# Patient Record
Sex: Female | Born: 1965 | Race: White | Hispanic: No | Marital: Married | Smoking: Never smoker
Health system: Southern US, Community
[De-identification: ages and names within clinical notes are randomized; demographics above are authoritative.]

## PROBLEM LIST (undated history)

## (undated) DIAGNOSIS — J45909 Unspecified asthma, uncomplicated: Secondary | ICD-10-CM

## (undated) DIAGNOSIS — G4700 Insomnia, unspecified: Secondary | ICD-10-CM

## (undated) DIAGNOSIS — F419 Anxiety disorder, unspecified: Secondary | ICD-10-CM

## (undated) DIAGNOSIS — D219 Benign neoplasm of connective and other soft tissue, unspecified: Secondary | ICD-10-CM

## (undated) HISTORY — DX: Benign neoplasm of connective and other soft tissue, unspecified: D21.9

## (undated) HISTORY — PX: CHOLECYSTECTOMY: SHX55

## (undated) HISTORY — PX: WISDOM TOOTH EXTRACTION: SHX21

## (undated) HISTORY — PX: OTHER SURGICAL HISTORY: SHX169

## (undated) HISTORY — DX: Unspecified asthma, uncomplicated: J45.909

## (undated) HISTORY — DX: Anxiety disorder, unspecified: F41.9

## (undated) HISTORY — DX: Insomnia, unspecified: G47.00

## (undated) HISTORY — PX: ABDOMINAL HYSTERECTOMY: SHX81

---

## 1998-04-25 ENCOUNTER — Other Ambulatory Visit: Admission: RE | Admit: 1998-04-25 | Discharge: 1998-04-25 | Payer: Self-pay | Admitting: Obstetrics & Gynecology

## 1999-04-17 ENCOUNTER — Other Ambulatory Visit: Admission: RE | Admit: 1999-04-17 | Discharge: 1999-04-17 | Payer: Self-pay | Admitting: Gynecology

## 2000-06-07 ENCOUNTER — Other Ambulatory Visit: Admission: RE | Admit: 2000-06-07 | Discharge: 2000-06-07 | Payer: Self-pay | Admitting: Gynecology

## 2002-05-15 ENCOUNTER — Other Ambulatory Visit: Admission: RE | Admit: 2002-05-15 | Discharge: 2002-05-15 | Payer: Self-pay | Admitting: Gynecology

## 2003-05-28 ENCOUNTER — Other Ambulatory Visit: Admission: RE | Admit: 2003-05-28 | Discharge: 2003-05-28 | Payer: Self-pay | Admitting: Gynecology

## 2004-06-15 ENCOUNTER — Other Ambulatory Visit: Admission: RE | Admit: 2004-06-15 | Discharge: 2004-06-15 | Payer: Self-pay | Admitting: Gynecology

## 2004-07-19 ENCOUNTER — Ambulatory Visit: Payer: Self-pay | Admitting: Internal Medicine

## 2005-06-21 ENCOUNTER — Other Ambulatory Visit: Admission: RE | Admit: 2005-06-21 | Discharge: 2005-06-21 | Payer: Self-pay | Admitting: Gynecology

## 2006-06-26 ENCOUNTER — Other Ambulatory Visit: Admission: RE | Admit: 2006-06-26 | Discharge: 2006-06-26 | Payer: Self-pay | Admitting: Gynecology

## 2006-08-02 ENCOUNTER — Ambulatory Visit: Payer: Self-pay | Admitting: Internal Medicine

## 2007-03-06 ENCOUNTER — Ambulatory Visit: Payer: Self-pay | Admitting: Internal Medicine

## 2007-03-06 DIAGNOSIS — F411 Generalized anxiety disorder: Secondary | ICD-10-CM

## 2007-03-06 DIAGNOSIS — F419 Anxiety disorder, unspecified: Secondary | ICD-10-CM | POA: Insufficient documentation

## 2007-03-06 DIAGNOSIS — R002 Palpitations: Secondary | ICD-10-CM | POA: Insufficient documentation

## 2007-04-21 ENCOUNTER — Ambulatory Visit: Payer: Self-pay | Admitting: Internal Medicine

## 2007-04-21 LAB — CONVERTED CEMR LAB
Albumin: 4 g/dL (ref 3.5–5.2)
Basophils Absolute: 0 10*3/uL (ref 0.0–0.1)
Eosinophils Absolute: 0.2 10*3/uL (ref 0.0–0.6)
GFR calc non Af Amer: 85 mL/min
HCT: 40.5 % (ref 36.0–46.0)
Hemoglobin: 13.5 g/dL (ref 12.0–15.0)
MCHC: 33.2 g/dL (ref 30.0–36.0)
MCV: 92.5 fL (ref 78.0–100.0)
Monocytes Absolute: 0.3 10*3/uL (ref 0.2–0.7)
Neutrophils Relative %: 54 % (ref 43.0–77.0)
Potassium: 4.1 meq/L (ref 3.5–5.1)
Sodium: 139 meq/L (ref 135–145)
TSH: 0.74 microintl units/mL (ref 0.35–5.50)
Vitamin B-12: 446 pg/mL (ref 211–911)

## 2007-05-01 ENCOUNTER — Ambulatory Visit: Payer: Self-pay | Admitting: Internal Medicine

## 2008-02-27 HISTORY — PX: OTHER SURGICAL HISTORY: SHX169

## 2009-02-07 ENCOUNTER — Ambulatory Visit (HOSPITAL_BASED_OUTPATIENT_CLINIC_OR_DEPARTMENT_OTHER): Admission: RE | Admit: 2009-02-07 | Discharge: 2009-02-08 | Payer: Self-pay | Admitting: Gynecology

## 2009-09-16 ENCOUNTER — Ambulatory Visit: Payer: Self-pay | Admitting: Internal Medicine

## 2009-10-10 ENCOUNTER — Telehealth: Payer: Self-pay | Admitting: Internal Medicine

## 2009-11-14 ENCOUNTER — Encounter: Payer: Self-pay | Admitting: Internal Medicine

## 2009-11-14 ENCOUNTER — Ambulatory Visit: Payer: Self-pay | Admitting: Internal Medicine

## 2009-11-17 ENCOUNTER — Encounter (INDEPENDENT_AMBULATORY_CARE_PROVIDER_SITE_OTHER): Payer: Self-pay | Admitting: *Deleted

## 2009-11-17 ENCOUNTER — Telehealth: Payer: Self-pay | Admitting: Internal Medicine

## 2010-03-28 NOTE — Assessment & Plan Note (Addendum)
Summary: wheezing/cd   Vital Signs:  Patient profile:   45 year old female Weight:      132 pounds O2 Sat:      98 % on Room air Temp:     97.3 degrees F oral Pulse rate:   94 / minute Pulse rhythm:   regular Resp:     16 per minute BP sitting:   100 / 60  (left arm) Cuff size:   regular  Vitals Entered By: Lanier Prude, CMA(AAMA) (September 16, 2009 3:55 PM)  O2 Flow:  Room air CC:  dry cough/wheezing X 6 wks Is Patient Diabetic? No Comments pt is not taking Fluoxetine, Zolpiedem or Vit D.  please remove from list   CC:   dry cough/wheezing X 6 wks.  History of Present Illness: C/o cough, wheezing x 3-4 wks, triggered by stress. No URI. C/oo dry cough No allergies  Current Medications (verified): 1)  Fluoxetine Hcl 10 Mg  Caps (Fluoxetine Hcl) .... Take 1 Capsule By Mouth Daily 2)  Zolpidem Tartrate 10 Mg  Tabs (Zolpidem Tartrate) .... 1/2 or 1 By Mouth At [facility] Prn 3)  Vitamin D3 1000 Unit  Tabs (Cholecalciferol) .Marland Kitchen.. 1 Qd 4)  Microgestin 1/20 1-20 Mg-Mcg Tabs (Norethindrone Acet-Ethinyl Est)  Allergies (verified): 1)  ! Sulfa  Past History:  Past Medical History: Last updated: 03/06/2007 Anxiety Insomnia  Social History: Last updated: 03/06/2007 Occupation: Married Never Smoked Alcohol use-no Regular exercise-yes  Family History: No heart disease No asthma  Review of Systems       The patient complains of dyspnea on exertion, peripheral edema, and depression.  The patient denies fever, chest pain, and syncope.    Physical Exam  General:  Well-developed,well-nourished,in no acute distress; alert,appropriate and cooperative throughout examination Nose:  External nasal examination shows no deformity or inflammation. Nasal mucosa are pink and moist without lesions or exudates. Mouth:  Oral mucosa and oropharynx without lesions or exudates.  Teeth in good repair. Neck:  No deformities, masses, or tenderness noted. Lungs:  Normal respiratory effort, chest  expands symmetrically. Lungs are clear to auscultation, no crackles or wheezes. Heart:  Normal rate and regular rhythm. S1 and S2 normal without gallop, murmur, click, rub or other extra sounds. Abdomen:  Bowel sounds positive,abdomen soft and non-tender without masses, organomegaly or hernias noted. Extremities:  No clubbing, cyanosis, edema, or deformity noted with normal full range of motion of all joints.   Neurologic:  No cranial nerve deficits noted. Station and gait are normal. Plantar reflexes are down-going bilaterally. DTRs are symmetrical throughout. Sensory, motor and coordinative functions appear intact. Skin:  Intact without suspicious lesions or rashes R leg 6 mm mole Psych:  Cognition and judgment appear intact. Alert and cooperative with normal attention span and concentration. No apparent delusions, illusions, hallucinations   Impression & Recommendations:  Problem # 1:  COUGH (ICD-786.2) x 6 wks Assessment New Diff dx: asthmatic vs postnasal drip vs infectious vs GERD vs other CXR suggested Treat empirically: See "Patient Instructions".   Problem # 2:  Mole Assessment: New Bx suggested  Problem # 3:  ANXIETY (ICD-300.00) Assessment: Unchanged  Her updated medication list for this problem includes:    Fluoxetine Hcl 10 Mg Caps (Fluoxetine hcl) .Marland Kitchen... Take 1 capsule by mouth daily  Complete Medication List: 1)  Fluoxetine Hcl 10 Mg Caps (Fluoxetine hcl) .... Take 1 capsule by mouth daily 2)  Zolpidem Tartrate 10 Mg Tabs (Zolpidem tartrate) .... 1/2 or 1 by mouth at hs  prn 3)  Vitamin D3 1000 Unit Tabs (Cholecalciferol) .Marland Kitchen.. 1 qd 4)  Microgestin 1/20 1-20 Mg-mcg Tabs (Norethindrone acet-ethinyl est) 5)  Zithromax Z-pak 250 Mg Tabs (Azithromycin) .... As dirrected  Patient Instructions: 1)  Advair two times a day  2)  Nexium 1 a day 3)  Lodrain 1 in am 4)  Call in 2 wks Prescriptions: ZITHROMAX Z-PAK 250 MG TABS (AZITHROMYCIN) as dirrected  #1 x 0   Entered and  Authorized by:   Tresa Garter MD   Signed by:   Tresa Garter MD on 09/16/2009   Method used:   Print then Give to Patient   RxID:   [id number]

## 2010-03-28 NOTE — Letter (Addendum)
Summary: [facility] Consult Scheduled Letter  [facility]  [address] [place], [place] [address]   Phone: [phone]  Fax: [phone]      11/17/2009 MRN: [id number]  [facility] [address] [place], [place]  [address]    Dear Ms. Satterfield,      We have scheduled an appointment for you.  At the recommendation of Dr.Plotnikov, we have scheduled you a consult with Dr Sherene Sires on 12/06/09 at 10:15am.  Their phone number is [phone].  If this appointment day and time is not convenient for you, please feel free to call the office of the doctor you are being referred to at the number listed above and reschedule the appointment.    [facility] [address] [place], [place] [address] *Pulmonary Dept. 2nd Floor*    Please give 24hr notice if you need to cancel/reschedule to avoid a $50.00 fee. Also bring insurance card, any CT's or X-ray's that you may have taken out side of cone system, and any co-pay due at time of visit.     Thank you,  Patient Care Coordinator [facility]

## 2010-03-28 NOTE — Assessment & Plan Note (Addendum)
Summary: FU---STC   Vital Signs:  Patient profile:   45 year old female Height:      63 inches Weight:      134 pounds BMI:     23.82 Temp:     98.3 degrees F oral Pulse rate:   76 / minute Pulse rhythm:   regular Resp:     16 per minute BP sitting:   92 / 60  (left arm) Cuff size:   regular  Vitals Entered By: Lanier Prude, CMA(AAMA) (November 14, 2009 9:02 AM) CC: f/u Is Patient Diabetic? No Comments pt is not taking Fluoxetine, Zolpidem, Vit D or  Microgestin.  Pelase remove from list   CC:  f/u.  History of Present Illness: F/u wheezing 2-3 times a week; dry cough. She stopped running due to wheezing and SOB. She has been using Symbicort sporadically - on as needed basis with some relief.  Preventive Screening-Counseling & Management  Alcohol-Tobacco     Smoking Status: never  Current Medications (verified): 1)  Fluoxetine Hcl 10 Mg  Caps (Fluoxetine Hcl) .... Take 1 Capsule By Mouth Daily 2)  Zolpidem Tartrate 10 Mg  Tabs (Zolpidem Tartrate) .... 1/2 or 1 By Mouth At [facility] Prn 3)  Vitamin D3 1000 Unit  Tabs (Cholecalciferol) .Marland Kitchen.. 1 Qd 4)  Microgestin 1/20 1-20 Mg-Mcg Tabs (Norethindrone Acet-Ethinyl Est) 5)  Symbicort 160-4.5 Mcg/act Aero (Budesonide-Formoterol Fumarate) .Marland Kitchen.. 1-2 Inh Two Times A Day  Allergies (verified): 1)  ! Sulfa  Past History:  Past Medical History: Last updated: 03/06/2007 Anxiety Insomnia  Family History: Last updated: 09/16/2009 No heart disease No asthma  Social History: Last updated: 03/06/2007 Occupation: Married Never Smoked Alcohol use-no Regular exercise-yes  Review of Systems       The patient complains of dyspnea on exertion and prolonged cough.  The patient denies anorexia, weight loss, weight gain, chest pain, hemoptysis, abdominal pain, and fever.         No sweats  Physical Exam  General:  Well-developed,well-nourished,in no acute distress; alert,appropriate and cooperative throughout examination Ears:   External ear exam shows no significant lesions or deformities.  Otoscopic examination reveals clear canals, tympanic membranes are intact bilaterally without bulging, retraction, inflammation or discharge. Hearing is grossly normal bilaterally. Nose:  External nasal examination shows no deformity or inflammation. Nasal mucosa are pink and moist without lesions or exudates. Mouth:  Oral mucosa and oropharynx without lesions or exudates.  Teeth in good repair. Neck:  No deformities, masses, or tenderness noted. Lungs:  Normal respiratory effort, chest expands symmetrically. Lungs are clear to auscultation, no crackles or wheezes. Heart:  Normal rate and regular rhythm. S1 and S2 normal without gallop, murmur, click, rub or other extra sounds. Abdomen:  Bowel sounds positive,abdomen soft and non-tender without masses, organomegaly or hernias noted. Msk:  No deformity or scoliosis noted of thoracic or lumbar spine.   Neurologic:  No cranial nerve deficits noted. Station and gait are normal. Plantar reflexes are down-going bilaterally. DTRs are symmetrical throughout. Sensory, motor and coordinative functions appear intact. Skin:  Intact without suspicious lesions or rashes Cervical Nodes:  No lymphadenopathy noted Psych:  Cognition and judgment appear intact. Alert and cooperative with normal attention span and concentration. No apparent delusions, illusions, hallucinations   Impression & Recommendations:  Problem # 1:  COUGH (ICD-786.2) - likely asthmatic bronchitis ? postviral Assessment Unchanged PFT Use Symbicort 1 inh two times a day DAILY Orders: Pulmonary Referral (Pulmonary) T-2 View CXR, Same Day (71020.5TC)  Problem #  2:  ANXIETY (ICD-300.00) Assessment: Improved  Problem # 3:  PALPITATIONS (ICD-785.1) Assessment: Improved  Complete Medication List: 1)  Vitamin D3 1000 Unit Tabs (Cholecalciferol) .Marland Kitchen.. 1 qd 2)  Symbicort 160-4.5 Mcg/act Aero (Budesonide-formoterol fumarate) .Marland Kitchen..  1 inh two times a day  Patient Instructions: 1)  Please schedule a follow-up appointment in 4 months.

## 2010-03-28 NOTE — Progress Notes (Addendum)
Summary: CXR results  Phone Note Call from Patient   Summary of Call: pt wants to know if she needs Abx since xray shows bronchitis???? please advise  Leave Vm  on cell if no answer. Initial call taken by: Lanier Prude, [facility]),  November 17, 2009 11:50 AM  Follow-up for Phone Call        No. It is more like "asthma". No need for antibiotic - keep Pulm appt pls Follow-up by: Tresa Garter MD,  November 17, 2009 12:06 PM  Additional Follow-up for Phone Call Additional follow up Details #1::        Tried to contact pt # above no longer in service. Additional Follow-up by: Orlan Leavens RMA,  November 17, 2009 2:20 PM    Additional Follow-up for Phone Call Additional follow up Details #2::    pt informed  Follow-up by: Lanier Prude, [facility]),  November 18, 2009 8:53 AM

## 2010-03-28 NOTE — Progress Notes (Addendum)
Summary: med change  Phone Note Call from Patient Call back at Home Phone [phone]   Caller: Patient Summary of Call: Patient lmovm stating that she was given a sample of Advair and feels that its not working very well. Patient is requesting something different sent to [facility].Marland KitchenMarland KitchenMarland KitchenAlvy Beal Archie CMA  October 10, 2009 8:40 AM   Follow-up for Phone Call        Try Symbicort instead Follow-up by: Tresa Garter MD,  October 10, 2009 5:23 PM  Additional Follow-up for Phone Call Additional follow up Details #1::        call unable to be completed. Will try again later.........................Marland KitchenLamar Sprinkles, CMA  October 10, 2009 5:47 PM   Pt informed  Additional Follow-up by: Lamar Sprinkles, CMA,  October 11, 2009 3:11 PM    New/Updated Medications: SYMBICORT 160-4.5 MCG/ACT AERO (BUDESONIDE-FORMOTEROL FUMARATE) 1-2 inh two times a day Prescriptions: SYMBICORT 160-4.5 MCG/ACT AERO (BUDESONIDE-FORMOTEROL FUMARATE) 1-2 inh two times a day  #1 x 3   Entered by:   Lamar Sprinkles, CMA   Authorized by:   Tresa Garter MD   Signed by:   Lamar Sprinkles, CMA on 10/11/2009   Method used:   Faxed to ...       [facility] (retail)       [address] [place]       [address]       [place], [place]  [address]       Ph: [phone]       Fax: [phone]   RxID:   [id number]

## 2010-04-14 ENCOUNTER — Institutional Professional Consult (permissible substitution) (INDEPENDENT_AMBULATORY_CARE_PROVIDER_SITE_OTHER): Payer: BC Managed Care – PPO | Admitting: Pulmonary Disease

## 2010-04-14 ENCOUNTER — Encounter: Payer: Self-pay | Admitting: Pulmonary Disease

## 2010-04-14 DIAGNOSIS — J45909 Unspecified asthma, uncomplicated: Secondary | ICD-10-CM | POA: Insufficient documentation
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
## 2010-05-30 ENCOUNTER — Encounter: Payer: Self-pay | Admitting: Pulmonary Disease

## 2010-05-30 LAB — POCT PREGNANCY, URINE: Preg Test, Ur: NEGATIVE

## 2010-06-01 ENCOUNTER — Encounter: Payer: Self-pay | Admitting: Pulmonary Disease

## 2010-06-01 ENCOUNTER — Ambulatory Visit (INDEPENDENT_AMBULATORY_CARE_PROVIDER_SITE_OTHER): Payer: BC Managed Care – PPO | Admitting: Pulmonary Disease

## 2010-06-01 VITALS — BP 110/80 | HR 66 | Temp 97.9°F | Ht 63.0 in | Wt 140.2 lb

## 2010-06-01 DIAGNOSIS — J45909 Unspecified asthma, uncomplicated: Secondary | ICD-10-CM
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
## 2010-06-01 NOTE — Patient Instructions (Signed)
Stay on symbicort 160 twice daily. Around may 15 th, drop to once /day

## 2010-06-01 NOTE — Assessment & Plan Note (Signed)
Stay on symbicort 160 bid x 2 months , then attempt taper off If persistent will investigate for allergies

## 2010-08-04 ENCOUNTER — Encounter: Payer: Self-pay | Admitting: Pulmonary Disease

## 2010-08-04 ENCOUNTER — Ambulatory Visit (INDEPENDENT_AMBULATORY_CARE_PROVIDER_SITE_OTHER): Payer: BC Managed Care – PPO | Admitting: Pulmonary Disease

## 2010-08-04 DIAGNOSIS — J45909 Unspecified asthma, uncomplicated: Secondary | ICD-10-CM

## 2010-08-04 MED ORDER — OMEPRAZOLE 40 MG PO CPDR: 40.0000 mg | DELAYED_RELEASE_CAPSULE | Freq: Every day | ORAL | Status: DC
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
## 2010-08-04 NOTE — Patient Instructions (Signed)
Stay on symbicort 160 twice daily Use albuterol as needed Omeprazole (prilosec ) daily x 6 weeks

## 2010-08-07 NOTE — Assessment & Plan Note (Addendum)
Stay on symbicort 160 bid PPI for acid suppression x 6 wks If persistent symptoms, pursue other etiologies such as RAST, sinuses etc.

## 2011-02-28 ENCOUNTER — Other Ambulatory Visit: Payer: Self-pay | Admitting: Internal Medicine

## 2011-03-26 ENCOUNTER — Other Ambulatory Visit: Payer: Self-pay | Admitting: Gynecology

## 2011-03-26 DIAGNOSIS — Z1231 Encounter for screening mammogram for malignant neoplasm of breast: Secondary | ICD-10-CM

## 2011-04-06 ENCOUNTER — Ambulatory Visit: Payer: BC Managed Care – PPO

## 2011-04-06 ENCOUNTER — Ambulatory Visit
Admission: RE | Admit: 2011-04-06 | Discharge: 2011-04-06 | Disposition: A | Discharge status: Home / Self Care | Payer: BC Managed Care – PPO | Source: Ambulatory Visit | Attending: Gynecology | Admitting: Gynecology

## 2011-04-06 DIAGNOSIS — Z1231 Encounter for screening mammogram for malignant neoplasm of breast: Secondary | ICD-10-CM

## 2011-05-01 ENCOUNTER — Other Ambulatory Visit: Payer: Self-pay | Admitting: Pulmonary Disease

## 2011-05-01 MED ORDER — ALBUTEROL SULFATE HFA 108 (90 BASE) MCG/ACT IN AERS: 2.0000 | INHALATION_SPRAY | Freq: Four times a day (QID) | RESPIRATORY_TRACT | Status: DC | PRN

## 2011-11-20 ENCOUNTER — Other Ambulatory Visit: Payer: Self-pay | Admitting: Internal Medicine

## 2012-01-15 ENCOUNTER — Telehealth: Payer: Self-pay | Admitting: Pulmonary Disease

## 2012-01-15 NOTE — Telephone Encounter (Signed)
I spoke with pt and she stated she has had an increase in her inhaler use. She is not having to use it with exercise and has notice in the past 3 weeks it's getting worse. Pt needed an early morning appt. I advised would have to work her in RA schedule so it would be a wait. She was fine with coming in and seeing someone else until she got in to see RA. She is scheduled with TP 01/17/12 at 9 am d/t work schedule. Nothing further was needed

## 2012-01-17 ENCOUNTER — Encounter: Payer: Self-pay | Admitting: Adult Health

## 2012-01-17 ENCOUNTER — Ambulatory Visit (INDEPENDENT_AMBULATORY_CARE_PROVIDER_SITE_OTHER): Payer: BC Managed Care – PPO | Admitting: Adult Health

## 2012-01-17 VITALS — BP 132/70 | HR 99 | Temp 97.7°F | Ht 62.0 in | Wt 146.0 lb

## 2012-01-17 DIAGNOSIS — J45909 Unspecified asthma, uncomplicated: Secondary | ICD-10-CM

## 2012-01-17 MED ORDER — BUDESONIDE-FORMOTEROL FUMARATE 160-4.5 MCG/ACT IN AERO: 2.0000 | INHALATION_SPRAY | Freq: Two times a day (BID) | RESPIRATORY_TRACT | Status: DC

## 2012-01-17 NOTE — Patient Instructions (Addendum)
Restart Symbicort 160 2 puffs  twice daily, brush, rinse, gargle  Use albuterol as needed Omeprazole (prilosec ) daily x 6 weeks Zyrtec 10mg  At bedtime  For 6 weeks .  follow up Dr. Vassie Loll  In 6 weeks

## 2012-01-17 NOTE — Assessment & Plan Note (Signed)
Exercise induced asthma  Discussed triggers of Asthma w/ AR and GERD  She will re try PPI and zyrtec  Restart ICS/LABA combo on regular basis  Could consider MCT if she continues to doubt asthma dx.   Plan  Restart Symbicort 160 2 puffs  twice daily, brush, rinse, gargle  Use albuterol as needed Omeprazole (prilosec ) daily x 6 weeks Zyrtec 10mg  At bedtime  For 6 weeks .  follow up Dr. Vassie Loll  In 6 weeks
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
## 2012-02-14 ENCOUNTER — Ambulatory Visit: Payer: BC Managed Care – PPO | Admitting: Pulmonary Disease

## 2012-02-15 ENCOUNTER — Ambulatory Visit: Payer: BC Managed Care – PPO | Admitting: Pulmonary Disease

## 2012-05-11 ENCOUNTER — Ambulatory Visit: Payer: BC Managed Care – PPO | Admitting: Emergency Medicine

## 2012-05-11 VITALS — BP 104/64 | HR 93 | Temp 98.5°F | Resp 16 | Ht 63.0 in | Wt 146.6 lb

## 2012-05-11 DIAGNOSIS — L255 Unspecified contact dermatitis due to plants, except food: Secondary | ICD-10-CM

## 2012-05-11 MED ORDER — METHYLPREDNISOLONE ACETATE 80 MG/ML IJ SUSP
80.0000 mg | Freq: Once | INTRAMUSCULAR | Status: AC
  Administered 2012-05-11: 80 mg via INTRAMUSCULAR

## 2012-05-11 MED ORDER — TRIAMCINOLONE ACETONIDE 0.1 % EX CREA: TOPICAL_CREAM | Freq: Two times a day (BID) | CUTANEOUS | Status: DC
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
## 2012-05-11 NOTE — Patient Instructions (Addendum)
Poison Ivy  Poison ivy is a inflammation of the skin (contact dermatitis) caused by touching the allergens on the leaves of the ivy plant following previous exposure to the plant. The rash usually appears 48 hours after exposure. The rash is usually bumps (papules) or blisters (vesicles) in a linear pattern. Depending on your own sensitivity, the rash may simply cause redness and itching, or it may also progress to blisters which may break open. These must be well cared for to prevent secondary bacterial (germ) infection, followed by scarring. Keep any open areas dry, clean, dressed, and covered with an antibacterial ointment if needed. The eyes may also get puffy. The puffiness is worst in the morning and gets better as the day progresses. This dermatitis usually heals without scarring, within 2 to 3 weeks without treatment.  HOME CARE INSTRUCTIONS   Thoroughly wash with soap and water as soon as you have been exposed to poison ivy. You have about one half hour to remove the plant resin before it will cause the rash. This washing will destroy the oil or antigen on the skin that is causing, or will cause, the rash. Be sure to wash under your fingernails as any plant resin there will continue to spread the rash. Do not rub skin vigorously when washing affected area. Poison ivy cannot spread if no oil from the plant remains on your body. A rash that has progressed to weeping sores will not spread the rash unless you have not washed thoroughly. It is also important to wash any clothes you have been wearing as these may carry active allergens. The rash will return if you wear the unwashed clothing, even several days later.  Avoidance of the plant in the future is the best measure. Poison ivy plant can be recognized by the number of leaves. Generally, poison ivy has three leaves with flowering branches on a single stem.  Diphenhydramine may be purchased over the counter and used as needed for itching. Do not drive with  this medication if it makes you drowsy.Ask your caregiver about medication for children.  SEEK MEDICAL CARE IF:   Open sores develop.   Redness spreads beyond area of rash.   You notice purulent (pus-like) discharge.   You have increased pain.   Other signs of infection develop (such as fever).  Document Released: 02/10/2000 Document Revised: 05/07/2011 Document Reviewed: 12/29/2008  [employer] Patient Information 2013 [employer].

## 2012-05-15 ENCOUNTER — Other Ambulatory Visit: Payer: Self-pay | Admitting: Pulmonary Disease

## 2012-05-16 ENCOUNTER — Telehealth: Payer: Self-pay | Admitting: Pulmonary Disease

## 2012-05-16 MED ORDER — ALBUTEROL SULFATE HFA 108 (90 BASE) MCG/ACT IN AERS: 2.0000 | INHALATION_SPRAY | Freq: Four times a day (QID) | RESPIRATORY_TRACT | Status: DC | PRN

## 2012-05-16 NOTE — Telephone Encounter (Signed)
lmtcb x1--pt is due for OV then can send RX

## 2012-05-16 NOTE — Telephone Encounter (Signed)
Pt returned triage's call.  Pt states that she saw TP a few months ago & really needs this med refilled asap.  Pt states she is wheezing pretty badly.  I advised the pt that it had been since 2012 since she had seen RA, so I would have to schedule an appt.  Pt asked to be worked in this afternoon.  RA is not here, so I could not offer an appt w/ RA.  Pt asks to speak w/ nurse asap.    Kelly Lawrence

## 2012-05-16 NOTE — Telephone Encounter (Signed)
I spoke with pt. She scheduled ROV w/ RA 06/09/12 AT 4:00. Her rescue inhaler has been sent to the pharmacy. Pt did not want to scheduled visit for today. She stated when she uses her rescue inhaler it takes care of her wheezing. Nothing further was needed

## 2012-06-09 ENCOUNTER — Encounter: Payer: Self-pay | Admitting: Pulmonary Disease

## 2012-06-09 ENCOUNTER — Ambulatory Visit (INDEPENDENT_AMBULATORY_CARE_PROVIDER_SITE_OTHER): Payer: BC Managed Care – PPO | Admitting: Pulmonary Disease

## 2012-06-09 ENCOUNTER — Other Ambulatory Visit: Payer: Self-pay

## 2012-06-09 ENCOUNTER — Other Ambulatory Visit: Payer: Self-pay | Admitting: Pulmonary Disease

## 2012-06-09 VITALS — BP 114/72 | HR 61 | Temp 97.5°F | Ht 63.0 in | Wt 148.6 lb

## 2012-06-09 DIAGNOSIS — J45909 Unspecified asthma, uncomplicated: Secondary | ICD-10-CM

## 2012-06-09 DIAGNOSIS — Z1231 Encounter for screening mammogram for malignant neoplasm of breast: Secondary | ICD-10-CM
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
## 2012-06-09 NOTE — Patient Instructions (Addendum)
Stay on symbicort twice daily OK to use albuterol prior to exercise COngratulations on your half marathon !

## 2012-06-09 NOTE — Assessment & Plan Note (Signed)
No clear environmental trigger ? 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

## 2012-07-04 ENCOUNTER — Ambulatory Visit
Admission: RE | Admit: 2012-07-04 | Discharge: 2012-07-04 | Disposition: A | Discharge status: Home / Self Care | Payer: BC Managed Care – PPO | Source: Ambulatory Visit

## 2012-07-04 DIAGNOSIS — Z1231 Encounter for screening mammogram for malignant neoplasm of breast: Secondary | ICD-10-CM

## 2012-08-08 ENCOUNTER — Other Ambulatory Visit: Payer: Self-pay | Admitting: Pulmonary Disease

## 2012-11-07 ENCOUNTER — Other Ambulatory Visit (INDEPENDENT_AMBULATORY_CARE_PROVIDER_SITE_OTHER): Payer: BC Managed Care – PPO

## 2012-11-07 ENCOUNTER — Ambulatory Visit (INDEPENDENT_AMBULATORY_CARE_PROVIDER_SITE_OTHER): Payer: BC Managed Care – PPO | Admitting: Internal Medicine

## 2012-11-07 ENCOUNTER — Encounter: Payer: Self-pay | Admitting: Internal Medicine

## 2012-11-07 VITALS — BP 118/72 | HR 72 | Temp 98.6°F | Resp 16 | Wt 149.0 lb

## 2012-11-07 DIAGNOSIS — F411 Generalized anxiety disorder: Secondary | ICD-10-CM

## 2012-11-07 DIAGNOSIS — R5381 Other malaise: Secondary | ICD-10-CM

## 2012-11-07 DIAGNOSIS — R002 Palpitations: Secondary | ICD-10-CM

## 2012-11-07 LAB — SEDIMENTATION RATE: Sed Rate: 16 mm/hr (ref 0–22)

## 2012-11-07 LAB — HEPATIC FUNCTION PANEL
ALT: 17 U/L (ref 0–35)
AST: 18 U/L (ref 0–37)
Albumin: 3.6 g/dL (ref 3.5–5.2)
Alkaline Phosphatase: 54 U/L (ref 39–117)

## 2012-11-07 LAB — URINALYSIS
Bilirubin Urine: NEGATIVE
Ketones, ur: NEGATIVE
Total Protein, Urine: NEGATIVE
Urine Glucose: NEGATIVE

## 2012-11-07 LAB — BASIC METABOLIC PANEL
BUN: 7 mg/dL (ref 6–23)
Calcium: 9 mg/dL (ref 8.4–10.5)
Creatinine, Ser: 0.6 mg/dL (ref 0.4–1.2)
GFR: 110.51 mL/min (ref >60.00)
Glucose, Bld: 83 mg/dL (ref 70–99)

## 2012-11-07 LAB — CBC WITH DIFFERENTIAL/PLATELET
Basophils Absolute: 0 10*3/uL (ref 0.0–0.1)
HCT: 38.4 % (ref 36.0–46.0)
Lymphs Abs: 2.5 10*3/uL (ref 0.7–4.0)
Monocytes Absolute: 0.6 10*3/uL (ref 0.1–1.0)
Monocytes Relative: 7.1 % (ref 3.0–12.0)
Platelets: 312 10*3/uL (ref 150.0–400.0)
RDW: 13 % (ref 11.5–14.6)

## 2012-11-07 LAB — IBC PANEL: Iron: 65 ug/dL (ref 42–145)

## 2012-11-07 MED ORDER — CLONAZEPAM 0.25 MG PO TBDP: 0.2500 mg | ORAL_TABLET | Freq: Two times a day (BID) | ORAL | Status: DC | PRN

## 2012-11-07 NOTE — Assessment & Plan Note (Signed)
Labs

## 2012-11-07 NOTE — Progress Notes (Signed)
  Subjective:    Palpitations  This is a recurrent problem. The current episode started more than 1 year ago. The problem occurs intermittently. The problem has been waxing and waning. The symptoms are aggravated by unknown. Associated symptoms include anxiety. Pertinent negatives include no chest fullness, chest pain, coughing, dizziness, nausea, numbness, shortness of breath, syncope or weakness. She has tried nothing for the symptoms. There are no known risk factors. There is no history of anemia, drug use or hyperthyroidism.    C/o L ear sharp pains  rare C/o palpitations off and on; fatigue - all summer long Running for exercise - no sx's   Review of Systems  Constitutional: Negative for chills, activity change, appetite change, fatigue and unexpected weight change.  HENT: Negative for congestion, mouth sores and sinus pressure.   Eyes: Negative for visual disturbance.  Respiratory: Negative for cough, chest tightness, shortness of breath and wheezing.   Cardiovascular: Positive for palpitations. Negative for chest pain, leg swelling and syncope.  Gastrointestinal: Negative for nausea and abdominal pain.  Genitourinary: Negative for frequency, difficulty urinating and vaginal pain.  Musculoskeletal: Negative for back pain and gait problem.  Skin: Negative for pallor and rash.  Neurological: Negative for dizziness, tremors, weakness, numbness and headaches.  Psychiatric/Behavioral: Negative for suicidal ideas, confusion and sleep disturbance. The patient is not nervous/anxious.        Objective:   Physical Exam  Constitutional: She appears well-developed. No distress.  HENT:  Head: Normocephalic.  Right Ear: External ear normal.  Left Ear: External ear normal.  Nose: Nose normal.  Mouth/Throat: Oropharynx is clear and moist.  Eyes: Conjunctivae are normal. Pupils are equal, round, and reactive to light. Right eye exhibits no discharge. Left eye exhibits no discharge.  Neck:  Normal range of motion. Neck supple. No JVD present. No tracheal deviation present. No thyromegaly present.  Cardiovascular: Normal rate, regular rhythm and normal heart sounds.   Pulmonary/Chest: No stridor. No respiratory distress. She has no wheezes.  Abdominal: Soft. Bowel sounds are normal. She exhibits no distension and no mass. There is no tenderness. There is no rebound and no guarding.  Musculoskeletal: She exhibits no edema and no tenderness.  Lymphadenopathy:    She has no cervical adenopathy.  Neurological: She displays normal reflexes. No cranial nerve deficit. She exhibits normal muscle tone. Coordination normal.  Skin: No rash noted. No erythema.  Psychiatric: She has a normal mood and affect. Her behavior is normal. Judgment and thought content normal.          Assessment & Plan:

## 2012-11-10 NOTE — Assessment & Plan Note (Signed)
See prn meds

## 2012-12-02 ENCOUNTER — Other Ambulatory Visit: Payer: Self-pay | Admitting: Orthopedic Surgery

## 2012-12-02 ENCOUNTER — Ambulatory Visit
Admission: RE | Admit: 2012-12-02 | Discharge: 2012-12-02 | Disposition: A | Discharge status: Home / Self Care | Payer: BC Managed Care – PPO | Source: Ambulatory Visit | Attending: Orthopedic Surgery | Admitting: Orthopedic Surgery

## 2012-12-02 DIAGNOSIS — T148XXA Other injury of unspecified body region, initial encounter: Secondary | ICD-10-CM

## 2012-12-10 ENCOUNTER — Ambulatory Visit: Payer: BC Managed Care – PPO | Admitting: Pulmonary Disease

## 2013-01-01 ENCOUNTER — Other Ambulatory Visit: Payer: Self-pay

## 2013-03-13 ENCOUNTER — Encounter: Payer: Self-pay | Admitting: Gynecology

## 2013-03-13 ENCOUNTER — Ambulatory Visit (INDEPENDENT_AMBULATORY_CARE_PROVIDER_SITE_OTHER): Payer: BC Managed Care – PPO | Admitting: Gynecology

## 2013-03-13 VITALS — BP 120/70 | Ht 63.0 in | Wt 151.0 lb

## 2013-03-13 DIAGNOSIS — Z01419 Encounter for gynecological examination (general) (routine) without abnormal findings: Secondary | ICD-10-CM

## 2013-03-13 DIAGNOSIS — N83209 Unspecified ovarian cyst, unspecified side: Secondary | ICD-10-CM

## 2013-03-13 NOTE — Patient Instructions (Signed)
Follow up for ultrasound as scheduled 

## 2013-03-13 NOTE — Progress Notes (Signed)
Kelly Lawrence 08-Jul-1965 [id number]        48 y.o.  G2P2 new patient for annual exam.  Former patient of Dr. Ubaldo Glassing. Several issues noted below.  Past medical history,surgical history, problem list, medications, allergies, family history and social history were all reviewed and documented in the EPIC chart.  ROS:  Performed and pertinent positives and negatives are included in the history, assessment and plan .  Exam: Kim assistant Filed Vitals:   03/13/13 1456  BP: 120/70  Height: 5\' 3"  (1.6 m)  Weight: 151 lb (68.493 kg)   General appearance  Normal Skin grossly normal Head/Neck normal with no cervical or supraclavicular adenopathy thyroid normal Lungs  clear Cardiac RR, without RMG Abdominal  soft, nontender, without masses, organomegaly or hernia Breasts  examined lying and sitting without masses, retractions, discharge or axillary adenopathy. Pelvic  Ext/BUS/vagina  Normal  Cervix  Normal  Uterus  anteverted, normal size, shape and contour, midline and mobile nontender   Adnexa  Without masses or tenderness    Anus and perineum  Normal   Rectovaginal  Normal sphincter tone without palpated masses or tenderness.    Assessment/Plan:  48 y.o. G2P2 female for annual exam, regular menses, oral contraceptives, vasectomy.   1. Oral contraceptives. Patient on low dose oral contraceptives for menstrual suppression. Had heavy menses with cramping and left ovarian cyst. Was told by Dr. Ubaldo Glassing the cyst wrapped around her ovary by ultrasound several years ago. Was placed on oral contraceptives and did well but wants to stop them now. Husband had vasectomy and she just wants to be off of hormonal contraception. She also notes a decreased libido since being on the pills.  Exam today is normal. Recommend baseline ultrasound now for adnexal assessment. Patient will go ahead and stop her pills and then we'll see how she does. If worsening menses or pain she'll represent for  evaluation. 2. Patient initially noted uterus tacking but on review operative report she had a posterior enterocele repair with cardinal and uterosacral colposuspension by Dr. Ubaldo Glassing. Exam today shows good support. 3. Pap smear reported 02/2012. No Pap smear done today. No history of abnormal Pap smears previously. Recommend repeat at 3 year interval. 4. Mammography 06/2012. Continue with annual mammography. SBE monthly reviewed. 5. Health maintenance. Recently had checkup with Dr. Alain Marion to include lab work. No lab work done today. Followup for ultrasound otherwise annually.   Note: This document was prepared with digital dictation and possible smart phrase technology. Any transcriptional errors that result from this process are unintentional.   Anastasio Auerbach MD, 3:34 PM 03/13/2013

## 2013-03-29 DIAGNOSIS — D219 Benign neoplasm of connective and other soft tissue, unspecified: Secondary | ICD-10-CM

## 2013-03-29 HISTORY — DX: Benign neoplasm of connective and other soft tissue, unspecified: D21.9

## 2013-04-03 ENCOUNTER — Encounter: Payer: Self-pay | Admitting: Gynecology

## 2013-04-03 ENCOUNTER — Ambulatory Visit (INDEPENDENT_AMBULATORY_CARE_PROVIDER_SITE_OTHER): Payer: BC Managed Care – PPO

## 2013-04-03 ENCOUNTER — Ambulatory Visit (INDEPENDENT_AMBULATORY_CARE_PROVIDER_SITE_OTHER): Payer: BC Managed Care – PPO | Admitting: Gynecology

## 2013-04-03 DIAGNOSIS — N83209 Unspecified ovarian cyst, unspecified side: Secondary | ICD-10-CM

## 2013-04-03 DIAGNOSIS — D251 Intramural leiomyoma of uterus: Secondary | ICD-10-CM

## 2013-04-03 NOTE — Progress Notes (Signed)
Patient presents for ultrasound. Was told previously by Dr. Ubaldo Glassing and she had a cyst on her left ovary that wrapped around it. We asked to get a baseline ultrasound now just for pelvic surveillance. She had been on oral contraceptives for menorrhagia suppression and her husband had vasectomy and now she stopped them and are awaiting to see how her spontaneous menses do.  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 Endometrial echo 7.8 mm. Right and left ovaries visualized and normal. Cul-de-sac negative.  Assessment and plan: Small myoma otherwise normal ultrasound. Will keep menstrual calendar. As long as doing well then she will followup in a year for annual exam. Sooner if any issues.

## 2013-04-03 NOTE — Patient Instructions (Signed)
Followup in one year for annual exam, sooner if any issues 

## 2013-06-10 ENCOUNTER — Telehealth: Payer: Self-pay | Admitting: *Deleted

## 2013-06-10 MED ORDER — NORETHINDRONE ACET-ETHINYL EST 1-20 MG-MCG PO TABS: 1.0000 | ORAL_TABLET | Freq: Every day | ORAL | Status: DC

## 2013-06-10 NOTE — Telephone Encounter (Signed)
Okay to restart oral contraceptive. Recommend Loestrin 120 equivalent. Refill through January 2016. Remind patient main risk of oral contraceptive is blood clots like deep venous thrombosis, stroke and heart attack. Risk in an otherwise healthy woman who does not smoke is very low.

## 2013-06-10 NOTE — Telephone Encounter (Signed)
Pt called requesting to start back on low dose oral contraceptives for menstrual suppression. C/o heavy bleeding and cramping, pt was had annual on Jan 2015. Please advise

## 2013-06-10 NOTE — Telephone Encounter (Signed)
Pt informed with the below note, rx sent. 

## 2013-06-30 ENCOUNTER — Telehealth: Payer: Self-pay | Admitting: *Deleted

## 2013-06-30 NOTE — Telephone Encounter (Signed)
Pt called c/o break thru bleeding with new birth control pills, I explained to patient that not unusual to have irregular bleeding with new pills.

## 2013-07-10 ENCOUNTER — Other Ambulatory Visit: Payer: Self-pay

## 2013-07-10 DIAGNOSIS — Z1231 Encounter for screening mammogram for malignant neoplasm of breast: Secondary | ICD-10-CM

## 2013-07-27 ENCOUNTER — Other Ambulatory Visit: Payer: Self-pay | Admitting: Internal Medicine

## 2013-08-10 ENCOUNTER — Ambulatory Visit
Admission: RE | Admit: 2013-08-10 | Discharge: 2013-08-10 | Disposition: A | Discharge status: Home / Self Care | Payer: BC Managed Care – PPO | Source: Ambulatory Visit

## 2013-08-10 DIAGNOSIS — Z1231 Encounter for screening mammogram for malignant neoplasm of breast: Secondary | ICD-10-CM

## 2013-12-11 ENCOUNTER — Other Ambulatory Visit: Payer: Self-pay

## 2013-12-28 ENCOUNTER — Encounter: Payer: Self-pay | Admitting: Gynecology

## 2014-02-24 ENCOUNTER — Ambulatory Visit (INDEPENDENT_AMBULATORY_CARE_PROVIDER_SITE_OTHER): Payer: BC Managed Care – PPO | Admitting: Internal Medicine

## 2014-02-24 ENCOUNTER — Encounter: Payer: Self-pay | Admitting: Internal Medicine

## 2014-02-24 VITALS — BP 108/68 | HR 60 | Temp 98.3°F | Resp 12 | Ht 63.0 in | Wt 150.0 lb

## 2014-02-24 DIAGNOSIS — Z0000 Encounter for general adult medical examination without abnormal findings: Secondary | ICD-10-CM

## 2014-02-24 DIAGNOSIS — R002 Palpitations: Secondary | ICD-10-CM

## 2014-02-24 DIAGNOSIS — F411 Generalized anxiety disorder: Secondary | ICD-10-CM

## 2014-02-24 DIAGNOSIS — G4700 Insomnia, unspecified: Secondary | ICD-10-CM

## 2014-02-24 DIAGNOSIS — J4520 Mild intermittent asthma, uncomplicated: Secondary | ICD-10-CM

## 2014-02-24 MED ORDER — CLONAZEPAM 0.5 MG PO TABS: 0.5000 mg | ORAL_TABLET | Freq: Two times a day (BID) | ORAL | Status: DC | PRN

## 2014-02-24 NOTE — Assessment & Plan Note (Signed)
Clonazepam prn. Dose increased  Potential benefits of a long term benzodiazepines  use as well as potential risks  and complications were explained to the patient and were aknowledged.
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
## 2014-02-24 NOTE — Patient Instructions (Addendum)
Valerian root Valerian and hops Melatonin Less carbs at dinner Vit D 1000-2000 iu

## 2014-02-24 NOTE — Assessment & Plan Note (Signed)
Clonazepam prn - rare use. Dose increased. Use Valerian root  Potential benefits of a long term benzodiazepines  use as well as potential risks  and complications were explained to the patient and were aknowledged.

## 2014-02-24 NOTE — Assessment & Plan Note (Signed)
Much better  Use MDI prn

## 2014-02-24 NOTE — Assessment & Plan Note (Signed)
Better  

## 2014-03-06 ENCOUNTER — Other Ambulatory Visit: Payer: Self-pay | Admitting: Gynecology

## 2014-03-15 ENCOUNTER — Encounter: Payer: Self-pay | Admitting: Gynecology

## 2014-03-15 ENCOUNTER — Ambulatory Visit (INDEPENDENT_AMBULATORY_CARE_PROVIDER_SITE_OTHER): Payer: BLUE CROSS/BLUE SHIELD | Admitting: Gynecology

## 2014-03-15 VITALS — BP 120/76 | Ht 64.0 in | Wt 149.0 lb

## 2014-03-15 DIAGNOSIS — Z01419 Encounter for gynecological examination (general) (routine) without abnormal findings: Secondary | ICD-10-CM

## 2014-03-15 DIAGNOSIS — N926 Irregular menstruation, unspecified: Secondary | ICD-10-CM

## 2014-03-15 LAB — TSH: TSH: 1.189 u[IU]/mL (ref 0.350–4.500)

## 2014-03-15 LAB — CBC WITH DIFFERENTIAL/PLATELET
BASOS PCT: 1 % (ref 0–1)
Basophils Absolute: 0.1 10*3/uL (ref 0.0–0.1)
Eosinophils Absolute: 0.4 10*3/uL (ref 0.0–0.7)
Eosinophils Relative: 5 % (ref 0–5)
HEMATOCRIT: 38.1 % (ref 36.0–46.0)
Hemoglobin: 12.8 g/dL (ref 12.0–15.0)
Lymphocytes Relative: 33 % (ref 12–46)
Lymphs Abs: 2.5 10*3/uL (ref 0.7–4.0)
MCH: 30.6 pg (ref 26.0–34.0)
MCHC: 33.6 g/dL (ref 30.0–36.0)
MCV: 91.1 fL (ref 78.0–100.0)
MONOS PCT: 7 % (ref 3–12)
MPV: 9.6 fL (ref 8.6–12.4)
Monocytes Absolute: 0.5 10*3/uL (ref 0.1–1.0)
Neutro Abs: 4.2 10*3/uL (ref 1.7–7.7)
Neutrophils Relative %: 54 % (ref 43–77)
Platelets: 323 10*3/uL (ref 150–400)
RBC: 4.18 MIL/uL (ref 3.87–5.11)
RDW: 12.7 % (ref 11.5–15.5)
WBC: 7.7 10*3/uL (ref 4.0–10.5)

## 2014-03-15 LAB — PROLACTIN: Prolactin: 7.3 ng/mL

## 2014-03-15 MED ORDER — NORETHINDRONE-ETH ESTRADIOL 1-35 MG-MCG PO TABS: 1.0000 | ORAL_TABLET | Freq: Every day | ORAL | Status: DC

## 2014-03-15 NOTE — Progress Notes (Signed)
Kelly Lawrence 06-Apr-1965 [id number]        49 y.o.  G2P2 for annual exam.  Several issues noted below.  Past medical history,surgical history, problem list, medications, allergies, family history and social history were all reviewed and documented as reviewed in the EPIC chart.  ROS:  Performed with pertinent positives and negatives included in the history, assessment and plan.   Additional significant findings :  none   Exam: Kelly Lawrence Vitals:   03/15/14 1350  BP: 120/76  Height: 5\' 4"  (1.626 m)  Weight: 149 lb (67.586 kg)   General appearance:  Normal affect, orientation and appearance. Skin: Grossly normal HEENT: Without gross lesions.  No cervical or supraclavicular adenopathy. Thyroid normal.  Lungs:  Clear without wheezing, rales or rhonchi Cardiac: RR, without RMG Abdominal:  Soft, nontender, without masses, guarding, rebound, organomegaly or hernia Breasts:  Examined lying and sitting without masses, retractions, discharge or axillary adenopathy. Pelvic:  Ext/BUS/vagina normal with menses flow  Cervix normal with menses flow  Uterus anteverted, normal size, shape and contour, midline and mobile nontender   Adnexa  Without masses or tenderness    Anus and perineum  Normal   Rectovaginal  Normal sphincter tone without palpated masses or tenderness.    Assessment/Plan:  49 y.o. G2P2 female for annual exam, vasectomy birth control.   1. Irregular bleeding. Patient on low-dose oral contraceptives or menstrual regulation/dysmenorrhea control. Had tried stopping them last year after her husband's vasectomy but had heavy painful periods and reinitiated them. She notes the third week into the birth control pill she'll start to bleed before she is done with the active pills. Ultrasound last year showed small 20 mm myoma but no other abnormalities. Will check baseline labs to include CBC TSH prolactin. Schedule sonohysterogram rule out intracavitary abnormalities.  Management options to include changing pills, Mirena IUD trial, endometrial ablation, hysterectomy were reviewed. The pros/cons of each choice discussed. At this point patient does not want to do anything major and she is training for a marathon. Will switch her pills to the Ortho-Novum 1/35 equivalent and she'll follow up for the above testing. 2. Decreased libido. Reviewed issues with decreased libido. Also reviewed possible contribution from the oral contraceptives. Unfortunate she cannot function off of the oral contraceptives at this time. We'll further pursue other alternatives as above. 3. Pap smear 2014. No Pap smear done today. No history of abnormal Pap smears previously. Plan repeat Pap smear next year at three-year interval per current screening guidelines. 4. Mammography 07/2013. Continue with annual mammography. SBE monthly reviewed. 5. Health maintenance. No routine blood work done and she reports this done at Dr. Judeen Lawrence office. Follow up for above testing.     Kelly Auerbach MD, 2:26 PM 03/15/2014

## 2014-03-15 NOTE — Patient Instructions (Signed)
Follow up for the ultrasound as scheduled.  You may obtain a copy of any labs that were done today by logging onto MyChart as outlined in the instructions provided with your AVS (after visit summary). The office will not call with normal lab results but certainly if there are any significant abnormalities then we will contact you.   Health Maintenance, Female A healthy lifestyle and preventative care can promote health and wellness.  Maintain regular health, dental, and eye exams.  Eat a healthy diet. Foods like vegetables, fruits, whole grains, low-fat dairy products, and lean protein foods contain the nutrients you need without too many calories. Decrease your intake of foods high in solid fats, added sugars, and salt. Get information about a proper diet from your caregiver, if necessary.  Regular physical exercise is one of the most important things you can do for your health. Most adults should get at least 150 minutes of moderate-intensity exercise (any activity that increases your heart rate and causes you to sweat) each week. In addition, most adults need muscle-strengthening exercises on 2 or more days a week.   Maintain a healthy weight. The body mass index (BMI) is a screening tool to identify possible weight problems. It provides an estimate of body fat based on height and weight. Your caregiver can help determine your BMI, and can help you achieve or maintain a healthy weight. For adults 20 years and older:  A BMI below 18.5 is considered underweight.  A BMI of 18.5 to 24.9 is normal.  A BMI of 25 to 29.9 is considered overweight.  A BMI of 30 and above is considered obese.  Maintain normal blood lipids and cholesterol by exercising and minimizing your intake of saturated fat. Eat a balanced diet with plenty of fruits and vegetables. Blood tests for lipids and cholesterol should begin at age 71 and be repeated every 5 years. If your lipid or cholesterol levels are high, you are  over 50, or you are a high risk for heart disease, you may need your cholesterol levels checked more frequently.Ongoing high lipid and cholesterol levels should be treated with medicines if diet and exercise are not effective.  If you smoke, find out from your caregiver how to quit. If you do not use tobacco, do not start.  Lung cancer screening is recommended for adults aged 43 80 years who are at high risk for developing lung cancer because of a history of smoking. Yearly low-dose computed tomography (CT) is recommended for people who have at least a 30-pack-year history of smoking and are a current smoker or have quit within the past 15 years. A pack year of smoking is smoking an average of 1 pack of cigarettes a day for 1 year (for example: 1 pack a day for 30 years or 2 packs a day for 15 years). Yearly screening should continue until the smoker has stopped smoking for at least 15 years. Yearly screening should also be stopped for people who develop a health problem that would prevent them from having lung cancer treatment.  If you are pregnant, do not drink alcohol. If you are breastfeeding, be very cautious about drinking alcohol. If you are not pregnant and choose to drink alcohol, do not exceed 1 drink per day. One drink is considered to be 12 ounces (355 mL) of beer, 5 ounces (148 mL) of wine, or 1.5 ounces (44 mL) of liquor.  Avoid use of street drugs. Do not share needles with anyone. Ask for help  if you need support or instructions about stopping the use of drugs.  High blood pressure causes heart disease and increases the risk of stroke. Blood pressure should be checked at least every 1 to 2 years. Ongoing high blood pressure should be treated with medicines, if weight loss and exercise are not effective.  If you are 55 to 47 years old, ask your caregiver if you should take aspirin to prevent strokes.  Diabetes screening involves taking a blood sample to check your fasting blood sugar level.  This should be done once every 3 years, after age 45, if you are within normal weight and without risk factors for diabetes. Testing should be considered at a younger age or be carried out more frequently if you are overweight and have at least 1 risk factor for diabetes.  Breast cancer screening is essential preventative care for women. You should practice "breast self-awareness." This means understanding the normal appearance and feel of your breasts and may include breast self-examination. Any changes detected, no matter how small, should be reported to a caregiver. Women in their 20s and 30s should have a clinical breast exam (CBE) by a caregiver as part of a regular health exam every 1 to 3 years. After age 40, women should have a CBE every year. Starting at age 40, women should consider having a mammogram (breast X-ray) every year. Women who have a family history of breast cancer should talk to their caregiver about genetic screening. Women at a high risk of breast cancer should talk to their caregiver about having an MRI and a mammogram every year.  Breast cancer gene (BRCA)-related cancer risk assessment is recommended for women who have family members with BRCA-related cancers. BRCA-related cancers include breast, ovarian, tubal, and peritoneal cancers. Having family members with these cancers may be associated with an increased risk for harmful changes (mutations) in the breast cancer genes BRCA1 and BRCA2. Results of the assessment will determine the need for genetic counseling and BRCA1 and BRCA2 testing.  The Pap test is a screening test for cervical cancer. Women should have a Pap test starting at age 21. Between ages 21 and 29, Pap tests should be repeated every 2 years. Beginning at age 30, you should have a Pap test every 3 years as long as the past 3 Pap tests have been normal. If you had a hysterectomy for a problem that was not cancer or a condition that could lead to cancer, then you no  longer need Pap tests. If you are between ages 65 and 70, and you have had normal Pap tests going back 10 years, you no longer need Pap tests. If you have had past treatment for cervical cancer or a condition that could lead to cancer, you need Pap tests and screening for cancer for at least 20 years after your treatment. If Pap tests have been discontinued, risk factors (such as a new sexual partner) need to be reassessed to determine if screening should be resumed. Some women have medical problems that increase the chance of getting cervical cancer. In these cases, your caregiver may recommend more frequent screening and Pap tests.  The human papillomavirus (HPV) test is an additional test that may be used for cervical cancer screening. The HPV test looks for the virus that can cause the cell changes on the cervix. The cells collected during the Pap test can be tested for HPV. The HPV test could be used to screen women aged 30 years and older, and   should be used in women of any age who have unclear Pap test results. After the age of 7, women should have HPV testing at the same frequency as a Pap test.  Colorectal cancer can be detected and often prevented. Most routine colorectal cancer screening begins at the age of 20 and continues through age 36. However, your caregiver may recommend screening at an earlier age if you have risk factors for colon cancer. On a yearly basis, your caregiver may provide home test kits to check for hidden blood in the stool. Use of a small camera at the end of a tube, to directly examine the colon (sigmoidoscopy or colonoscopy), can detect the earliest forms of colorectal cancer. Talk to your caregiver about this at age 46, when routine screening begins. Direct examination of the colon should be repeated every 5 to 10 years through age 62, unless early forms of pre-cancerous polyps or small growths are found.  Hepatitis C blood testing is recommended for all people born  from [birth year] through 1963 and any individual with known risks for hepatitis C.  Practice safe sex. Use condoms and avoid high-risk sexual practices to reduce the spread of sexually transmitted infections (STIs). Sexually active women aged 55 and younger should be checked for Chlamydia, which is a common sexually transmitted infection. Older women with new or multiple partners should also be tested for Chlamydia. Testing for other STIs is recommended if you are sexually active and at increased risk.  Osteoporosis is a disease in which the bones lose minerals and strength with aging. This can result in serious bone fractures. The risk of osteoporosis can be identified using a bone density scan. Women ages 31 and over and women at risk for fractures or osteoporosis should discuss screening with their caregivers. Ask your caregiver whether you should be taking a calcium supplement or vitamin D to reduce the rate of osteoporosis.  Menopause can be associated with physical symptoms and risks. Hormone replacement therapy is available to decrease symptoms and risks. You should talk to your caregiver about whether hormone replacement therapy is right for you.  Use sunscreen. Apply sunscreen liberally and repeatedly throughout the day. You should seek shade when your shadow is shorter than you. Protect yourself by wearing long sleeves, pants, a wide-brimmed hat, and sunglasses year round, whenever you are outdoors.  Notify your caregiver of new moles or changes in moles, especially if there is a change in shape or color. Also notify your caregiver if a mole is larger than the size of a pencil eraser.  Stay current with your immunizations. Document Released: 08/28/2010 Document Revised: 06/09/2012 Document Reviewed: 08/28/2010 [facility] Patient Information 2014 [place].

## 2014-03-17 ENCOUNTER — Other Ambulatory Visit: Payer: Self-pay | Admitting: Gynecology

## 2014-03-17 DIAGNOSIS — N946 Dysmenorrhea, unspecified: Secondary | ICD-10-CM

## 2014-03-17 DIAGNOSIS — N926 Irregular menstruation, unspecified: Secondary | ICD-10-CM

## 2014-03-22 ENCOUNTER — Ambulatory Visit (INDEPENDENT_AMBULATORY_CARE_PROVIDER_SITE_OTHER): Payer: BLUE CROSS/BLUE SHIELD | Admitting: Gynecology

## 2014-03-22 ENCOUNTER — Other Ambulatory Visit: Payer: Self-pay | Admitting: Gynecology

## 2014-03-22 ENCOUNTER — Encounter: Payer: Self-pay | Admitting: Gynecology

## 2014-03-22 ENCOUNTER — Ambulatory Visit (INDEPENDENT_AMBULATORY_CARE_PROVIDER_SITE_OTHER): Payer: BLUE CROSS/BLUE SHIELD

## 2014-03-22 DIAGNOSIS — N921 Excessive and frequent menstruation with irregular cycle: Secondary | ICD-10-CM

## 2014-03-22 DIAGNOSIS — N926 Irregular menstruation, unspecified: Secondary | ICD-10-CM

## 2014-03-22 DIAGNOSIS — N946 Dysmenorrhea, unspecified: Secondary | ICD-10-CM

## 2014-03-22 DIAGNOSIS — D251 Intramural leiomyoma of uterus: Secondary | ICD-10-CM

## 2014-03-22 NOTE — Patient Instructions (Signed)
Start on the birth control pill as we discussed. Call if breakthrough bleeding continues after several packs.

## 2014-03-22 NOTE — Progress Notes (Signed)
Kelly Lawrence 22-Nov-1965 [id number]        49 y.o.  G2P2 presents for sonohysterogram due to persistent breakthrough bleeding on oral contraceptives. CBC, TSH and prolactin were normal.  Past medical history,surgical history, problem list, medications, allergies, family history and social history were all reviewed and documented in the EPIC chart.  Directed ROS with pertinent positives and negatives documented in the history of present illness/assessment and plan.  Exam: [place] assistant General appearance:  Normal External BUS vagina normal. Cervix normal.  Ultrasound shows uterus normal size. 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 Abuts against posterior endometrial wall with no significant intracavitary portion. Endometrial echo 9.6 mm. Right and left ovaries with physiologic changes. Cul-de-sac negative.  Sonohysterogram performed, sterile technique, easy catheter introduction, good distention with no abnormalities. Endometrial sample taken. Patient tolerated well.  Assessment/Plan:  49 y.o. G2P2 with history as above. Patient will follow up for endometrial biopsy results. Just started the new or oral contraceptives with a slightly higher dose of estrogen at the 1/35 level. Will cycle times several cycles. Assuming she does well then we'll continue for the year. If rectal bleeding continue she'll follow up after several paks.     Anastasio Auerbach MD, 12:50 PM 03/22/2014

## 2014-03-29 ENCOUNTER — Other Ambulatory Visit: Payer: Self-pay | Admitting: Gynecology

## 2014-07-21 ENCOUNTER — Other Ambulatory Visit: Payer: Self-pay

## 2014-07-21 DIAGNOSIS — Z1231 Encounter for screening mammogram for malignant neoplasm of breast: Secondary | ICD-10-CM

## 2014-08-19 ENCOUNTER — Ambulatory Visit: Payer: Self-pay

## 2014-08-24 ENCOUNTER — Ambulatory Visit
Admission: RE | Admit: 2014-08-24 | Discharge: 2014-08-24 | Disposition: A | Discharge status: Home / Self Care | Payer: BLUE CROSS/BLUE SHIELD | Source: Ambulatory Visit

## 2014-08-24 DIAGNOSIS — Z1231 Encounter for screening mammogram for malignant neoplasm of breast: Secondary | ICD-10-CM

## 2014-12-09 ENCOUNTER — Ambulatory Visit: Payer: BLUE CROSS/BLUE SHIELD | Admitting: Internal Medicine

## 2015-02-05 ENCOUNTER — Other Ambulatory Visit: Payer: Self-pay | Admitting: Gynecology

## 2015-02-23 ENCOUNTER — Encounter: Payer: Self-pay | Admitting: Internal Medicine

## 2015-02-23 ENCOUNTER — Ambulatory Visit (INDEPENDENT_AMBULATORY_CARE_PROVIDER_SITE_OTHER): Payer: BLUE CROSS/BLUE SHIELD | Admitting: Internal Medicine

## 2015-02-23 VITALS — BP 106/68 | HR 58 | Temp 98.2°F | Wt 151.0 lb

## 2015-02-23 DIAGNOSIS — L309 Dermatitis, unspecified: Secondary | ICD-10-CM

## 2015-02-23 MED ORDER — HALOBETASOL PROPIONATE 0.05 % EX OINT: TOPICAL_OINTMENT | CUTANEOUS | Status: DC

## 2015-02-23 NOTE — Progress Notes (Signed)
Subjective:  Patient ID: Kelly Lawrence, female    DOB: [birth year]  Age: 49 y.o. MRN: [id number]  CC: No chief complaint on file.   HPI Kelly Lawrence presents for a rash on R chin x 3 weeks  Outpatient Prescriptions Prior to Visit  Medication Sig Dispense Refill  . Cholecalciferol (VITAMIN D PO) Take by mouth.    . clonazePAM (KLONOPIN) 0.5 MG tablet Take 1 tablet (0.5 mg total) by mouth 2 (two) times daily as needed for anxiety (or prn insomnia). 60 tablet 2  . ferrous sulfate 325 (65 FE) MG tablet Take 325 mg by mouth at bedtime.      . Multiple Vitamin (MULTIVITAMIN) tablet Take 1 tablet by mouth daily.     Marland Kitchen NECON 1/35, 28, tablet TAKE 1 TABLET BY MOUTH EVERY DAY. USE CONTINUOUSLY. 56 tablet 0  . PROAIR HFA 108 (90 BASE) MCG/ACT inhaler INHALE 2 PUFFS BY MOUTH EVERY 6 HOURS AS NEEDED 1 Inhaler 3  . MICROGESTIN 1-20 MG-MCG tablet TAKE 1 TABLET BY MOUTH EVERY DAY (Patient not taking: Reported on 02/23/2015) 21 tablet 0   No facility-administered medications prior to visit.    ROS Review of Systems  Constitutional: Negative for chills, activity change, fatigue and unexpected weight change.  HENT: Negative for congestion, mouth sores and sinus pressure.   Eyes: Negative for visual disturbance.  Respiratory: Negative for cough and chest tightness.   Genitourinary: Negative for difficulty urinating.  Musculoskeletal: Negative for back pain.  Skin: Positive for rash. Negative for pallor.  Neurological: Negative for dizziness, numbness and headaches.  Psychiatric/Behavioral: Negative for sleep disturbance.   Papular rash on R chin Objective:  BP 106/68 mmHg  Pulse 58  Temp(Src) 98.2 F (36.8 C) (Oral)  Wt 151 lb (68.493 kg)  SpO2 97%  BP Readings from Last 3 Encounters:  02/23/15 106/68  03/15/14 120/76  02/24/14 108/68    Wt Readings from Last 3 Encounters:  02/23/15 151 lb (68.493 kg)  03/15/14 149 lb (67.586 kg)  02/24/14 150 lb (68.04 kg)    Physical Exam    Constitutional: No distress.  Musculoskeletal: She exhibits no edema.  Skin: Rash noted. No erythema.  Psychiatric: She has a normal mood and affect.    Lab Results  Component Value Date   WBC 7.7 03/15/2014   HGB 12.8 03/15/2014   HCT 38.1 03/15/2014   PLT 323 03/15/2014   GLUCOSE 83 11/07/2012   ALT 17 11/07/2012   AST 18 11/07/2012   NA 139 11/07/2012   K 3.9 11/07/2012   CL 106 11/07/2012   CREATININE 0.6 11/07/2012   BUN 7 11/07/2012   CO2 28 11/07/2012   TSH 1.189 03/15/2014    Mm Digital Screening Bilateral  08/25/2014  CLINICAL DATA:  Screening. EXAM: DIGITAL SCREENING BILATERAL MAMMOGRAM WITH CAD COMPARISON:  Previous exam(s). ACR Breast Density Category d: The breast tissue is extremely dense, which lowers the sensitivity of mammography. FINDINGS: There are no findings suspicious for malignancy. Images were processed with CAD. IMPRESSION: No mammographic evidence of malignancy. A result letter of this screening mammogram will be mailed directly to the patient. RECOMMENDATION: Screening mammogram in one year. (Code:SM-B-01Y) BI-RADS CATEGORY  1: Negative. Electronically Signed   By: Conchita Paris M.D.   On: 08/25/2014 14:46    Assessment & Plan:   Diagnoses and all orders for this visit:  Dermatitis  Other orders -     halobetasol (ULTRAVATE) 0.05 % ointment; Use bid on rash  I have discontinued Ms. Underberg's MICROGESTIN. I am also having her start on halobetasol. Additionally, I am having her maintain her ferrous sulfate, multivitamin, PROAIR HFA, clonazePAM, Cholecalciferol (VITAMIN D PO), and NECON 1/35 (28).  Meds ordered this encounter  Medications  . halobetasol (ULTRAVATE) 0.05 % ointment    Sig: Use bid on rash    Dispense:  15 g    Refill:  1     Follow-up: No Follow-up on file.  Walker Kehr, MD

## 2015-02-23 NOTE — Assessment & Plan Note (Addendum)
Chin rash dermatitis Ultravate cream

## 2015-02-23 NOTE — Progress Notes (Signed)
Pre visit review using our clinic review tool, if applicable. No additional management support is needed unless otherwise documented below in the visit note. 

## 2015-02-28 ENCOUNTER — Encounter: Payer: Self-pay | Admitting: Internal Medicine

## 2015-03-18 ENCOUNTER — Encounter: Payer: Self-pay | Admitting: Gynecology

## 2015-03-18 ENCOUNTER — Ambulatory Visit (INDEPENDENT_AMBULATORY_CARE_PROVIDER_SITE_OTHER): Payer: BLUE CROSS/BLUE SHIELD | Admitting: Gynecology

## 2015-03-18 VITALS — BP 110/64 | Ht 63.0 in | Wt 153.0 lb

## 2015-03-18 DIAGNOSIS — Z01419 Encounter for gynecological examination (general) (routine) without abnormal findings: Secondary | ICD-10-CM

## 2015-03-18 DIAGNOSIS — Z13220 Encounter for screening for lipoid disorders: Secondary | ICD-10-CM | POA: Diagnosis not present

## 2015-03-18 MED ORDER — NORETHINDRONE-ETH ESTRADIOL 1-35 MG-MCG PO TABS: ORAL_TABLET | ORAL | Status: DC

## 2015-03-18 NOTE — Progress Notes (Signed)
Kelly Lawrence [birth year] [id number]        50 y.o.  G2P2  for annual exam.  Doing well. Several issues noted below.  Past medical history,surgical history, problem list, medications, allergies, family history and social history were all reviewed and documented as reviewed in the EPIC chart.  ROS:  Performed with pertinent positives and negatives included in the history, assessment and plan.   Additional significant findings :  none   Exam: [name] assistant Filed Vitals:   03/18/15 1419  BP: 110/64  Height: 5\' 3"  (1.6 m)  Weight: 153 lb (69.4 kg)   General appearance:  Normal affect, orientation and appearance. Skin: Grossly normal HEENT: Without gross lesions.  No cervical or supraclavicular adenopathy. Thyroid normal.  Lungs:  Clear without wheezing, rales or rhonchi Cardiac: RR, without RMG Abdominal:  Soft, nontender, without masses, guarding, rebound, organomegaly or hernia Breasts:  Examined lying and sitting without masses, retractions, discharge or axillary adenopathy. Pelvic:  Ext/BUS/vagina normal  Cervix normal  Uterus anteverted, normal size, shape and contour, midline and mobile nontender   Adnexa  Without masses or tenderness    Anus and perineum  Normal   Rectovaginal  Normal sphincter tone without palpated masses or tenderness.    Assessment/Plan:  50 y.o. G2P2 female for annual exam without menses, continuous oral contraceptives and vasectomy.   1. Continuous oral contraceptives. Patient continues on Ortho-Novum 1/35 equivalents continuously for menstrual suppression. Whenever she discontinues that she has irregular on and off bleeding.  sonohysterogram and biopsy negative last year with normal TSH. Husband with vasectomy. Options for management discussed to include continued oral contraceptives either at the 1/35 dose or considering decreasing to the 1/20 dose. Risks to include thrombosis such as stroke heart attack DVT reviewed. Possible lower risk with  lower dose pill. Does not smoke or being followed for medical issues. Runs marathons.  Alternatives to include trial of Mirena IUD for continued menstrual suppression or stopping the pills altogether in seeing what she does discussed. Patient interested in trying Mirena. We'll continue on the 1/35 for now 3 months prescribed and she'll follow for IUD placement. If she decides against this and wants to continue on the pills she will call we'll switch her to the 1/20 equivalent. 2. Pap smear 2014. No Pap smear done today. No history of significant abnormal Pap smears. Plan repeat Pap smear next year at three-year interval. 3. Mammography 07/2014. Continue with annual mammography when due. SBE monthly reviewed. 4. Health maintenance. Patient requests baseline labs and will return fasting. Future orders placed for CBC, comprehensive metabolic panel, lipid profile and TSH. Urine analysis today. Follow up for IUD or for new dose of oral contraceptives if she changes her mind.   Anastasio Auerbach MD, 2:49 PM 03/18/2015

## 2015-03-18 NOTE — Patient Instructions (Signed)
Follow up for your IUD as you schedule. If you decide against this and will continue on a lower dose pill and call us so that we can prescribe it.  Follow up for fasting lab work as ordered.  You may obtain a copy of any labs that were done today by logging onto MyChart as outlined in the instructions provided with your AVS (after visit summary). The office will not call with normal lab results but certainly if there are any significant abnormalities then we will contact you.   Health Maintenance Adopting a healthy lifestyle and getting preventive care can go a long way to promote health and wellness. Talk with your health care provider about what schedule of regular examinations is right for you. This is a good chance for you to check in with your provider about disease prevention and staying healthy. In between checkups, there are plenty of things you can do on your own. Experts have done a lot of research about which lifestyle changes and preventive measures are most likely to keep you healthy. Ask your health care provider for more information. WEIGHT AND DIET  Eat a healthy diet  Be sure to include plenty of vegetables, fruits, low-fat dairy products, and lean protein.  Do not eat a lot of foods high in solid fats, added sugars, or salt.  Get regular exercise. This is one of the most important things you can do for your health.  Most adults should exercise for at least 150 minutes each week. The exercise should increase your heart rate and make you sweat (moderate-intensity exercise).  Most adults should also do strengthening exercises at least twice a week. This is in addition to the moderate-intensity exercise.  Maintain a healthy weight  Body mass index (BMI) is a measurement that can be used to identify possible weight problems. It estimates body fat based on height and weight. Your health care provider can help determine your BMI and help you achieve or maintain a healthy  weight.  For females 27 years of age and older:   A BMI below 18.5 is considered underweight.  A BMI of 18.5 to 24.9 is normal.  A BMI of 25 to 29.9 is considered overweight.  A BMI of 30 and above is considered obese.  Watch levels of cholesterol and blood lipids  You should start having your blood tested for lipids and cholesterol at 48 years of age, then have this test every 5 years.  You may need to have your cholesterol levels checked more often if:  Your lipid or cholesterol levels are high.  You are older than 48 years of age.  You are at high risk for heart disease.  CANCER SCREENING   Lung Cancer  Lung cancer screening is recommended for adults 67-32 years old who are at high risk for lung cancer because of a history of smoking.  A yearly low-dose CT scan of the lungs is recommended for people who:  Currently smoke.  Have quit within the past 15 years.  Have at least a 30-pack-year history of smoking. A pack year is smoking an average of one pack of cigarettes a day for 1 year.  Yearly screening should continue until it has been 15 years since you quit.  Yearly screening should stop if you develop a health problem that would prevent you from having lung cancer treatment.  Breast Cancer  Practice breast self-awareness. This means understanding how your breasts normally appear and feel.  It also means doing  regular breast self-exams. Let your health care provider know about any changes, no matter how small.  If you are in your 20s or 30s, you should have a clinical breast exam (CBE) by a health care provider every 1-3 years as part of a regular health exam.  If you are 95 or older, have a CBE every year. Also consider having a breast X-ray (mammogram) every year.  If you have a family history of breast cancer, talk to your health care provider about genetic screening.  If you are at high risk for breast cancer, talk to your health care provider about  having an MRI and a mammogram every year.  Breast cancer gene (BRCA) assessment is recommended for women who have family members with BRCA-related cancers. BRCA-related cancers include:  Breast.  Ovarian.  Tubal.  Peritoneal cancers.  Results of the assessment will determine the need for genetic counseling and BRCA1 and BRCA2 testing. Cervical Cancer Routine pelvic examinations to screen for cervical cancer are no longer recommended for nonpregnant women who are considered low risk for cancer of the pelvic organs (ovaries, uterus, and vagina) and who do not have symptoms. A pelvic examination may be necessary if you have symptoms including those associated with pelvic infections. Ask your health care provider if a screening pelvic exam is right for you.   The Pap test is the screening test for cervical cancer for women who are considered at risk.  If you had a hysterectomy for a problem that was not cancer or a condition that could lead to cancer, then you no longer need Pap tests.  If you are older than 65 years, and you have had normal Pap tests for the past 10 years, you no longer need to have Pap tests.  If you have had past treatment for cervical cancer or a condition that could lead to cancer, you need Pap tests and screening for cancer for at least 20 years after your treatment.  If you no longer get a Pap test, assess your risk factors if they change (such as having a new sexual partner). This can affect whether you should start being screened again.  Some women have medical problems that increase their chance of getting cervical cancer. If this is the case for you, your health care provider may recommend more frequent screening and Pap tests.  The human papillomavirus (HPV) test is another test that may be used for cervical cancer screening. The HPV test looks for the virus that can cause cell changes in the cervix. The cells collected during the Pap test can be tested for  HPV.  The HPV test can be used to screen women 77 years of age and older. Getting tested for HPV can extend the interval between normal Pap tests from three to five years.  An HPV test also should be used to screen women of any age who have unclear Pap test results.  After 48 years of age, women should have HPV testing as often as Pap tests.  Colorectal Cancer  This type of cancer can be detected and often prevented.  Routine colorectal cancer screening usually begins at 48 years of age and continues through 48 years of age.  Your health care provider may recommend screening at an earlier age if you have risk factors for colon cancer.  Your health care provider may also recommend using home test kits to check for hidden blood in the stool.  A small camera at the end of a tube  can be used to examine your colon directly (sigmoidoscopy or colonoscopy). This is done to check for the earliest forms of colorectal cancer.  Routine screening usually begins at age 9.  Direct examination of the colon should be repeated every 5-10 years through 48 years of age. However, you may need to be screened more often if early forms of precancerous polyps or small growths are found. Skin Cancer  Check your skin from head to toe regularly.  Tell your health care provider about any new moles or changes in moles, especially if there is a change in a mole's shape or color.  Also tell your health care provider if you have a mole that is larger than the size of a pencil eraser.  Always use sunscreen. Apply sunscreen liberally and repeatedly throughout the day.  Protect yourself by wearing long sleeves, pants, a wide-brimmed hat, and sunglasses whenever you are outside. HEART DISEASE, DIABETES, AND HIGH BLOOD PRESSURE   Have your blood pressure checked at least every 1-2 years. High blood pressure causes heart disease and increases the risk of stroke.  If you are between 30 years and 76 years old, ask  your health care provider if you should take aspirin to prevent strokes.  Have regular diabetes screenings. This involves taking a blood sample to check your fasting blood sugar level.  If you are at a normal weight and have a low risk for diabetes, have this test once every three years after 48 years of age.  If you are overweight and have a high risk for diabetes, consider being tested at a younger age or more often. PREVENTING INFECTION  Hepatitis B  If you have a higher risk for hepatitis B, you should be screened for this virus. You are considered at high risk for hepatitis B if:  You were born in a country where hepatitis B is common. Ask your health care provider which countries are considered high risk.  Your parents were born in a high-risk country, and you have not been immunized against hepatitis B (hepatitis B vaccine).  You have HIV or AIDS.  You use needles to inject street drugs.  You live with someone who has hepatitis B.  You have had sex with someone who has hepatitis B.  You get hemodialysis treatment.  You take certain medicines for conditions, including cancer, organ transplantation, and autoimmune conditions. Hepatitis C  Blood testing is recommended for:  Everyone born from [birth year] through 1963.  Anyone with known risk factors for hepatitis C. Sexually transmitted infections (STIs)  You should be screened for sexually transmitted infections (STIs) including gonorrhea and chlamydia if:  You are sexually active and are younger than 48 years of age.  You are older than 48 years of age and your health care provider tells you that you are at risk for this type of infection.  Your sexual activity has changed since you were last screened and you are at an increased risk for chlamydia or gonorrhea. Ask your health care provider if you are at risk.  If you do not have HIV, but are at risk, it may be recommended that you take a prescription medicine daily to  prevent HIV infection. This is called pre-exposure prophylaxis (PrEP). You are considered at risk if:  You are sexually active and do not regularly use condoms or know the HIV status of your partner(s).  You take drugs by injection.  You are sexually active with a partner who has HIV. Talk with your  health care provider about whether you are at high risk of being infected with HIV. If you choose to begin PrEP, you should first be tested for HIV. You should then be tested every 3 months for as long as you are taking PrEP.  PREGNANCY   If you are premenopausal and you may become pregnant, ask your health care provider about preconception counseling.  If you may become pregnant, take 400 to 800 micrograms (mcg) of folic acid every day.  If you want to prevent pregnancy, talk to your health care provider about birth control (contraception). OSTEOPOROSIS AND MENOPAUSE   Osteoporosis is a disease in which the bones lose minerals and strength with aging. This can result in serious bone fractures. Your risk for osteoporosis can be identified using a bone density scan.  If you are 86 years of age or older, or if you are at risk for osteoporosis and fractures, ask your health care provider if you should be screened.  Ask your health care provider whether you should take a calcium or vitamin D supplement to lower your risk for osteoporosis.  Menopause may have certain physical symptoms and risks.  Hormone replacement therapy may reduce some of these symptoms and risks. Talk to your health care provider about whether hormone replacement therapy is right for you.  HOME CARE INSTRUCTIONS   Schedule regular health, dental, and eye exams.  Stay current with your immunizations.   Do not use any tobacco products including cigarettes, chewing tobacco, or electronic cigarettes.  If you are pregnant, do not drink alcohol.  If you are breastfeeding, limit how much and how often you drink  alcohol.  Limit alcohol intake to no more than 1 drink per day for nonpregnant women. One drink equals 12 ounces of beer, 5 ounces of wine, or 1 ounces of hard liquor.  Do not use street drugs.  Do not share needles.  Ask your health care provider for help if you need support or information about quitting drugs.  Tell your health care provider if you often feel depressed.  Tell your health care provider if you have ever been abused or do not feel safe at home. Document Released: 08/28/2010 Document Revised: 06/29/2013 Document Reviewed: 01/14/2013 [facility] Patient Information 2015 [place], [place]. This information is not intended to replace advice given to you by your health care provider. Make sure you discuss any questions you have with your health care provider.

## 2015-03-19 LAB — URINALYSIS W MICROSCOPIC + REFLEX CULTURE
BILIRUBIN URINE: NEGATIVE
CRYSTALS: NONE SEEN [HPF]
Casts: NONE SEEN [LPF]
GLUCOSE, UA: NEGATIVE
KETONES UR: NEGATIVE
Nitrite: NEGATIVE
Protein, ur: NEGATIVE
SPECIFIC GRAVITY, URINE: 1.004 (ref 1.001–1.035)
Yeast: NONE SEEN [HPF]
pH: 6.5 (ref 5.0–8.0)

## 2015-03-20 LAB — URINE CULTURE

## 2015-03-21 ENCOUNTER — Other Ambulatory Visit: Payer: BLUE CROSS/BLUE SHIELD

## 2015-03-21 ENCOUNTER — Other Ambulatory Visit: Payer: Self-pay | Admitting: Gynecology

## 2015-03-21 DIAGNOSIS — Z01419 Encounter for gynecological examination (general) (routine) without abnormal findings: Secondary | ICD-10-CM

## 2015-03-21 DIAGNOSIS — Z13220 Encounter for screening for lipoid disorders: Secondary | ICD-10-CM

## 2015-03-21 LAB — CBC WITH DIFFERENTIAL/PLATELET
BASOS PCT: 1 % (ref 0–1)
Basophils Absolute: 0.1 10*3/uL (ref 0.0–0.1)
Eosinophils Absolute: 0.3 10*3/uL (ref 0.0–0.7)
Eosinophils Relative: 4 % (ref 0–5)
HCT: 38.9 % (ref 36.0–46.0)
HEMOGLOBIN: 12.9 g/dL (ref 12.0–15.0)
LYMPHS ABS: 1.9 10*3/uL (ref 0.7–4.0)
LYMPHS PCT: 29 % (ref 12–46)
MCH: 30.3 pg (ref 26.0–34.0)
MCHC: 33.2 g/dL (ref 30.0–36.0)
MCV: 91.3 fL (ref 78.0–100.0)
MONOS PCT: 7 % (ref 3–12)
MPV: 9.9 fL (ref 8.6–12.4)
Monocytes Absolute: 0.5 10*3/uL (ref 0.1–1.0)
NEUTROS ABS: 3.8 10*3/uL (ref 1.7–7.7)
NEUTROS PCT: 59 % (ref 43–77)
Platelets: 310 10*3/uL (ref 150–400)
RBC: 4.26 MIL/uL (ref 3.87–5.11)
RDW: 12.4 % (ref 11.5–15.5)
WBC: 6.5 10*3/uL (ref 4.0–10.5)

## 2015-03-21 LAB — COMPREHENSIVE METABOLIC PANEL
ALBUMIN: 3.5 g/dL — AB (ref 3.6–5.1)
ALT: 17 U/L (ref 6–29)
AST: 22 U/L (ref 10–35)
Alkaline Phosphatase: 37 U/L (ref 33–115)
BUN: 10 mg/dL (ref 7–25)
CALCIUM: 8.7 mg/dL (ref 8.6–10.2)
CO2: 25 mmol/L (ref 20–31)
Chloride: 105 mmol/L (ref 98–110)
Creat: 0.69 mg/dL (ref 0.50–1.10)
GLUCOSE: 85 mg/dL (ref 65–99)
POTASSIUM: 4.3 mmol/L (ref 3.5–5.3)
Sodium: 139 mmol/L (ref 135–146)
Total Bilirubin: 0.4 mg/dL (ref 0.2–1.2)
Total Protein: 5.5 g/dL — ABNORMAL LOW (ref 6.1–8.1)

## 2015-03-21 LAB — LIPID PANEL
CHOL/HDL RATIO: 2.9 ratio (ref <=5.0)
Cholesterol: 165 mg/dL (ref 125–200)
HDL: 57 mg/dL (ref >=46)
LDL CALC: 92 mg/dL (ref <130)
TRIGLYCERIDES: 78 mg/dL (ref <150)
VLDL: 16 mg/dL (ref <30)

## 2015-03-21 LAB — TSH: TSH: 1.544 u[IU]/mL (ref 0.350–4.500)

## 2015-03-28 ENCOUNTER — Telehealth: Payer: Self-pay | Admitting: Gynecology

## 2015-03-28 NOTE — Telephone Encounter (Signed)
0130/17-Pt was advised today that her [facility] in is putting the cost of the Mirena towards her unmet deductible. Her deductible is $2500 and she has only met $523.98 so far. Her cost for the Mirena and insertion is the allowable of $875.50 for the IUD and $185.39 for the insertion(Per Gretta Arab) for total due of $1060.89. Pt states she doesn't get a cycle as she takes her [facility] every day and will call if she decides to proceed. Per Renee @ [facility]- REF #[id number]. Dx for Mirena is N94.89, not contraception per TF.wl

## 2015-04-04 ENCOUNTER — Ambulatory Visit: Payer: BLUE CROSS/BLUE SHIELD | Admitting: Gynecology

## 2015-04-21 ENCOUNTER — Telehealth: Payer: Self-pay | Admitting: Internal Medicine

## 2015-04-21 MED ORDER — CLINDAMYCIN PHOSPHATE 1 % EX LOTN: TOPICAL_LOTION | Freq: Two times a day (BID) | CUTANEOUS | Status: DC

## 2015-04-21 NOTE — Telephone Encounter (Signed)
Pt was given halobetasol (ULTRAVATE) 0.05 % ointment [id number] for a rash on her face. The rash hasn't gone away yet. Is there something else that she can try? Pharmacy is [facility] on [address]

## 2015-04-21 NOTE — Telephone Encounter (Signed)
Try Clindamycin lotion - emailed

## 2015-04-22 NOTE — Telephone Encounter (Signed)
LVM for pt to call back as soon as possible.   RE: detailed message with rx change.

## 2015-04-30 ENCOUNTER — Other Ambulatory Visit: Payer: Self-pay | Admitting: Gynecology

## 2015-05-02 NOTE — Telephone Encounter (Signed)
Per note on 03/18/15   "Patient interested in trying Mirena. We'll continue on the 1/35 for now 3 months prescribed and she'll follow for IUD placement. If she decides against this and wants to continue on the pills she will call we'll switch her to the 1/20 equivalent."

## 2015-06-08 ENCOUNTER — Telehealth: Payer: Self-pay | Admitting: *Deleted

## 2015-06-08 MED ORDER — NORETHINDRONE ACET-ETHINYL EST 1-20 MG-MCG PO TABS: 1.0000 | ORAL_TABLET | Freq: Every day | ORAL | Status: DC

## 2015-06-08 NOTE — Telephone Encounter (Signed)
Okay for LoEstrin 1/20 equivalent. Tell patient I will decrease her estrogen dose with this new pill which will decrease her risk of blood clots which is the main risk of birth control pills. Refill 1 year

## 2015-06-08 NOTE — Telephone Encounter (Signed)
Left message for pt to call.

## 2015-06-08 NOTE — Telephone Encounter (Signed)
Pt aware Rx sent.  

## 2015-06-08 NOTE — Telephone Encounter (Signed)
Pt called to follow up from annual on 03/18/15, pt said she doesn't want to proceed with Mirena IUD and would like new Rx for birth control pills. Please advise

## 2015-06-10 ENCOUNTER — Other Ambulatory Visit: Payer: Self-pay | Admitting: Gynecology

## 2015-06-13 NOTE — Telephone Encounter (Signed)
PT WAS PRESCRIBED DIFFERENT PILL SEE NOTE ON 06/08/15

## 2015-06-24 DIAGNOSIS — L710 Perioral dermatitis: Secondary | ICD-10-CM | POA: Diagnosis not present

## 2015-08-15 ENCOUNTER — Other Ambulatory Visit: Payer: Self-pay | Admitting: Gynecology

## 2015-08-15 DIAGNOSIS — Z1231 Encounter for screening mammogram for malignant neoplasm of breast: Secondary | ICD-10-CM

## 2015-08-26 ENCOUNTER — Ambulatory Visit
Admission: RE | Admit: 2015-08-26 | Discharge: 2015-08-26 | Disposition: A | Discharge status: Home / Self Care | Payer: BLUE CROSS/BLUE SHIELD | Source: Ambulatory Visit | Attending: Gynecology | Admitting: Gynecology

## 2015-08-26 DIAGNOSIS — Z1231 Encounter for screening mammogram for malignant neoplasm of breast: Secondary | ICD-10-CM

## 2015-11-25 DIAGNOSIS — H5213 Myopia, bilateral: Secondary | ICD-10-CM | POA: Diagnosis not present

## 2016-01-05 ENCOUNTER — Encounter: Payer: Self-pay | Admitting: Gynecology

## 2016-01-05 ENCOUNTER — Ambulatory Visit (INDEPENDENT_AMBULATORY_CARE_PROVIDER_SITE_OTHER): Payer: BLUE CROSS/BLUE SHIELD | Admitting: Gynecology

## 2016-01-05 VITALS — BP 120/76

## 2016-01-05 DIAGNOSIS — N921 Excessive and frequent menstruation with irregular cycle: Secondary | ICD-10-CM

## 2016-01-05 LAB — CBC WITH DIFFERENTIAL/PLATELET
BASOS ABS: 81 {cells}/uL (ref 0–200)
Basophils Relative: 1 %
EOS ABS: 243 {cells}/uL (ref 15–500)
EOS PCT: 3 %
HEMATOCRIT: 36.4 % (ref 35.0–45.0)
HEMOGLOBIN: 12.2 g/dL (ref 11.7–15.5)
LYMPHS ABS: 2754 {cells}/uL (ref 850–3900)
Lymphocytes Relative: 34 %
MCH: 30.3 pg (ref 27.0–33.0)
MCHC: 33.5 g/dL (ref 32.0–36.0)
MCV: 90.3 fL (ref 80.0–100.0)
MPV: 9.6 fL (ref 7.5–12.5)
Monocytes Absolute: 810 cells/uL (ref 200–950)
Monocytes Relative: 10 %
NEUTROS PCT: 52 %
Neutro Abs: 4212 cells/uL (ref 1500–7800)
Platelets: 371 10*3/uL (ref 140–400)
RBC: 4.03 MIL/uL (ref 3.80–5.10)
RDW: 12.6 % (ref 11.0–15.0)
WBC: 8.1 10*3/uL (ref 3.8–10.8)

## 2016-01-05 LAB — TSH: TSH: 0.94 mIU/L

## 2016-01-05 MED ORDER — MEGESTROL ACETATE 20 MG PO TABS: 20.0000 mg | ORAL_TABLET | Freq: Every day | ORAL | 1 refills | Status: DC

## 2016-01-05 NOTE — Patient Instructions (Signed)
Follow up for ultrasound as scheduled.  Take the Megace medication as needed for bleeding.

## 2016-01-05 NOTE — Progress Notes (Signed)
    Kelly Lawrence 06-Aug-1965 [id number]        50 y.o.  G2P2 presents with a long history of heavy painful menses. Started on low-dose oral contraceptives for menstrual suppression despite husband's vasectomy with initial good results. Started developing side effects from the birth control pills to include emotional changes, decreased libido and acne. She stopped the birth control pills in June had several reasonable menses afterwards but then started experiencing heavy bleeding to include frequent pad changes and bleedthrough episodes. Currently is at the end of a heavy bleeding episode. Had sonohysterogram with negative endometrial biopsy January 2016.  Past medical history,surgical history, problem list, medications, allergies, family history and social history were all reviewed and documented in the EPIC chart.  Directed ROS with pertinent positives and negatives documented in the history of present illness/assessment and plan.  Exam: [name] assistant Vitals:   01/05/16 1624  BP: 120/76   General appearance:  Normal Abdomen soft nontender without masses guarding rebound Ulrike external BUS vagina with menstrual flow. Cervix were normal. Uterus grossly normal midline mobile nontender. Adnexa without masses or tenderness.  Assessment/Plan:  50 y.o. G2P2 with long history of heavy painful menses. Initially suppressed with oral contraceptives. Stopped due to side effects. Now with return of her heavy menses. We'll check baseline CBC FSH TSH and recommended baseline sonohysterogram since last study was 2 years ago. Options for management pending results reviewed to include hormonal manipulation with trial of different birth control pill, progesterone only, Mirena IUD, endometrial ablation, Lysteda, hysterectomy. She does have a history of posterior colporrhaphy with enterocele repair by Dr. Ubaldo Glassing in the issue of cul-de-sac obliteration complicating hysterectomy was discussed also. Pros and  cons of each choice reviewed. Patient will follow up for her blood test results and the sonohysterogram and then we'll go from there.     Anastasio Auerbach MD, 5:07 PM 01/05/2016

## 2016-01-06 LAB — FOLLICLE STIMULATING HORMONE: FSH: 35 m[IU]/mL

## 2016-01-07 ENCOUNTER — Encounter: Payer: Self-pay | Admitting: Gynecology

## 2016-01-11 ENCOUNTER — Ambulatory Visit (INDEPENDENT_AMBULATORY_CARE_PROVIDER_SITE_OTHER): Payer: BLUE CROSS/BLUE SHIELD | Admitting: Gynecology

## 2016-01-11 ENCOUNTER — Encounter: Payer: Self-pay | Admitting: Gynecology

## 2016-01-11 ENCOUNTER — Ambulatory Visit (INDEPENDENT_AMBULATORY_CARE_PROVIDER_SITE_OTHER): Payer: BLUE CROSS/BLUE SHIELD

## 2016-01-11 ENCOUNTER — Other Ambulatory Visit: Payer: Self-pay | Admitting: Gynecology

## 2016-01-11 VITALS — BP 120/76

## 2016-01-11 DIAGNOSIS — N939 Abnormal uterine and vaginal bleeding, unspecified: Secondary | ICD-10-CM | POA: Diagnosis not present

## 2016-01-11 DIAGNOSIS — N921 Excessive and frequent menstruation with irregular cycle: Secondary | ICD-10-CM

## 2016-01-11 NOTE — Progress Notes (Signed)
    Kelly Lawrence 1965-06-19 [id number]        50 y.o.  G2P2 presents for sonohysterogram with history of heavy painful periods. Had negative sonohysterogram January 2016. Was started on oral contraceptives with good menstrual suppression. Has been using vasectomy for contraception. Ultimately ended up stopping the birth control pills because of side effects including emotional changes decreased libido and acne. Started bleeding heavily over the last several cycles with passage of clots and bleedthrough episodes.  Past medical history,surgical history, problem list, medications, allergies, family history and social history were all reviewed and documented in the EPIC chart.  Directed ROS with pertinent positives and negatives documented in the history of present illness/assessment and plan.  Exam: [place] assistant BP 120/76 General appearance:  Normal Abdomen soft nontender without masses guarding rebound Pelvic external BUS vagina normal. Cervix normal. Uterus grossly normal size midline mobile nontender. Adnexa without masses or tenderness.  Ultrasound transabdominal and transvaginal shows uterus anteverted with homogeneous echotexture. Normal size. Endometrial echo 4.1 mm. Right and left ovaries grossly normal. Cul-de-sac negative  Sonohysterogram performed, sterile technique, easy catheter introduction, good distention with no abnormalities. Endometrial sample taken.  Assessment/Plan:  50 y.o. G2P2 with history of menorrhagia with normal sonohysterogram. Endometrial sample taken. Patient will follow up for these results.  Most recent labs showed a hemoglobin of 12 normal TSH and a marginally elevated FSH of 35. I reviewed with her that this may indicate a perimenopausal situation. Whether temporizing treatments will get her through menopause such as Mirena IUD or endometrial ablation. Could continue with hormonal manipulation such as intermittent progesterone or different types of oral  contraceptives. Trial of Lysteda also discussed. Definitive treatment with hysterectomy reviewed. Issue of her having posterior colporrhaphy and enterocele repair by Dr. Ubaldo Glassing may complicate attempted minimalistic surgery such as TVH or LAVH and may ultimately result in a larger incision with longer recovery period patient will think about her options and follow up with me for the biopsy results and her ultimate decision.  Greater than 10  minutes of my time in excess of her sonohysterogram was spent in direct face to face counseling and coordination of care in regards to her treatment options for her menorrhagia and irregular bleeding     Anastasio Auerbach MD, 9:17 AM 01/11/2016

## 2016-01-11 NOTE — Patient Instructions (Signed)
Office will call you with biopsy results Think of your treatment options as we discussed.

## 2016-01-12 ENCOUNTER — Telehealth: Payer: Self-pay

## 2016-01-12 ENCOUNTER — Ambulatory Visit: Payer: Self-pay | Admitting: Gynecology

## 2016-01-12 ENCOUNTER — Other Ambulatory Visit: Payer: Self-pay

## 2016-01-12 NOTE — Telephone Encounter (Signed)
That is a little more involved discussion the just providing a prescription. I see that she is already on Klonopin by Dr. Alain Marion. The issue is whether patients are on a daily medication such as Prozac or one of the other types of medication versus a when necessary education. I would recommend discussing this with Dr. Alain Marion or myself with an office visit.

## 2016-01-12 NOTE — Telephone Encounter (Addendum)
I called patient with path report and left a message at same time that I did receive note that she wants to schedule ablation. I told her that I will check ins benefits for her and aware that she wants it after Dec 12.

## 2016-01-13 ENCOUNTER — Telehealth: Payer: Self-pay

## 2016-01-13 NOTE — Telephone Encounter (Signed)
Patient wondered if going back on her birth control pills to control the bleeding might be a possibility instead of ablation? (Ablation going to cost her a lot out of pocket.)

## 2016-01-13 NOTE — Telephone Encounter (Signed)
I called patient and discussed insurance benefits for [facility] Ablation in the office and her estimated surgery prepayment amount.  Patient said her periods have not regulated since coming off the pill and had two last mos.  She is going to call me with Day One of her cycle so that I can try to fit her in before the next period.    Dr. Tennis Ship said you had prescribed Megace for her to take if she had another bad episode of bleeding prior to being able to schedule. I had mentioned Prometrium Day 3 to her and she asked if the Megace would interfere with that. I told her it would not.

## 2016-01-13 NOTE — Telephone Encounter (Signed)
We had talked about this and was an option. She was having a lot of side effects from from the pills she was on before but if she wants to try a different pill she could try LoLoestrin which is the lowest dose pill available which may not suppress her libido is much as the other pill. The only drawback is that it is more expensive than the generic pills. She was on a low-dose pill previously. The only choice would be to go to a higher dose pill.

## 2016-01-13 NOTE — Telephone Encounter (Signed)
She can start the Megace with the next period and stay on it through the procedure and that will take the place of Prometrium

## 2016-01-17 ENCOUNTER — Encounter: Payer: Self-pay | Admitting: Gynecology

## 2016-01-17 MED ORDER — NORETHINDRONE ACET-ETHINYL EST 1-20 MG-MCG PO TABS: 1.0000 | ORAL_TABLET | Freq: Every day | ORAL | 2 refills | Status: DC

## 2016-01-17 NOTE — Telephone Encounter (Signed)
Can start now.  Call in refill through next annual exam

## 2016-01-18 NOTE — Telephone Encounter (Signed)
Left message for patient to call.

## 2016-01-24 NOTE — Telephone Encounter (Signed)
I called patient and left detailed message on voice mail and read her what Dr. Dorette Grate response was. I asked her to call me when she can and we can review again and discuss.

## 2016-03-06 ENCOUNTER — Telehealth: Payer: Self-pay

## 2016-03-06 NOTE — Telephone Encounter (Signed)
I would go ahead and stop the pills and start Megace 20 mg daily. Continue this through her ablation which can then be scheduled any time. She is using vasectomy for contraception.

## 2016-03-06 NOTE — Telephone Encounter (Signed)
Patient re-started on OC's in Nov per 01/17/16 patient email in her chart.  She said she has taken 3 packs continuous and has bled every single day. Not heavy but a continuous flow every day.  She is ready to schedule ablation in February.  1.  What to recommend regarding currently bleeding on OC?  2.  How do I pick when to schedule ablation if she is bleeding all the time?

## 2016-03-07 NOTE — Telephone Encounter (Signed)
I spoke to patient and advised her of Dr. Dorette Grate recommendation. She will d/c OC now and start Megace. She has Rx on hand for 20mg  with a refill and was instructed to take one daily and call in bleeding not less in several days.  We discussed dates for Her [facility] and decided on 03/21/16.  Rosemarie Ax will call her and sched pre and post op appts with Dr. Loetta Rough.  I discussed need for full bladder for procedure and reviewed those instructions. I will also mail those instructions with the date/times of all her appts. We discussed the need to have someone drive her to and from the procedure that day. Also, that she should take the day off from work that day for recovery.   I did not remember to tell her about Cytotec the night before with this conversation but I will be calling her back to review that with her.

## 2016-03-07 NOTE — Telephone Encounter (Signed)
Called patient and left detailed message. Asked her to call me when she can.

## 2016-03-08 ENCOUNTER — Other Ambulatory Visit: Payer: Self-pay | Admitting: Gynecology

## 2016-03-08 DIAGNOSIS — N921 Excessive and frequent menstruation with irregular cycle: Secondary | ICD-10-CM

## 2016-03-08 MED ORDER — MISOPROSTOL 200 MCG PO TABS: ORAL_TABLET | ORAL | 0 refills | Status: DC

## 2016-03-08 NOTE — Telephone Encounter (Signed)
I called patient and discussed with her need for Cytotec tab vaginally hs before procedure. Rx sent.   We discussed the discrepancies between online benefits, the recorded benefit line and her ins card. Online shows 0% co-insurance as well as recorded benefits but I noticed her ins card states 20%.  She understands we will just collect surgery prepymt as previously discussed but if 20% co-ins she may have a bill afterwards for that amount. She is fine with that. Financial letter mailed.  I also informed her of procedure time 10:30 am and check in at 10:15am.

## 2016-03-13 ENCOUNTER — Ambulatory Visit (INDEPENDENT_AMBULATORY_CARE_PROVIDER_SITE_OTHER): Payer: BLUE CROSS/BLUE SHIELD | Admitting: Internal Medicine

## 2016-03-13 ENCOUNTER — Encounter: Payer: Self-pay | Admitting: Internal Medicine

## 2016-03-13 DIAGNOSIS — R002 Palpitations: Secondary | ICD-10-CM

## 2016-03-13 DIAGNOSIS — G4709 Other insomnia: Secondary | ICD-10-CM

## 2016-03-13 DIAGNOSIS — F411 Generalized anxiety disorder: Secondary | ICD-10-CM

## 2016-03-13 DIAGNOSIS — G4700 Insomnia, unspecified: Secondary | ICD-10-CM | POA: Insufficient documentation

## 2016-03-13 MED ORDER — ZOLPIDEM TARTRATE 5 MG PO TABS: 5.0000 mg | ORAL_TABLET | Freq: Every evening | ORAL | 1 refills | Status: DC | PRN

## 2016-03-13 MED ORDER — BUPROPION HCL ER (SR) 100 MG PO TB12: 100.0000 mg | ORAL_TABLET | ORAL | 5 refills | Status: DC

## 2016-03-13 MED ORDER — VITAMIN D3 50 MCG (2000 UT) PO CAPS: 2000.0000 [IU] | ORAL_CAPSULE | Freq: Every day | ORAL | 3 refills | Status: DC

## 2016-03-13 NOTE — Progress Notes (Signed)
Subjective:  Patient ID: Kelly Lawrence, female    DOB: 03/19/1965  Age: 51 y.o. MRN: [id number]  CC: No chief complaint on file.   HPI KENIJAH SAWDON presents for insomnia - Klonopin is not working. She is worried a lot about things. C/o wt gain. Just had labs w/gyn. C/o moodiness, lack of attenion  Outpatient Medications Prior to Visit  Medication Sig Dispense Refill  . clindamycin (CLEOCIN T) 1 % lotion Apply topically 2 (two) times daily. 60 mL 0  . ferrous sulfate 325 (65 FE) MG tablet Take 325 mg by mouth at bedtime.      . halobetasol (ULTRAVATE) 0.05 % ointment Use bid on rash 15 g 1  . megestrol (MEGACE) 20 MG tablet Take 1 tablet (20 mg total) by mouth daily. 30 tablet 1  . misoprostol (CYTOTEC) 200 MCG tablet Insert tab VAGINALLY hs before surgery. 1 tablet 0  . Multiple Vitamin (MULTIVITAMIN) tablet Take 1 tablet by mouth daily.     . norethindrone-ethinyl estradiol (MICROGESTIN,JUNEL,LOESTRIN) 1-20 MG-MCG tablet Take 1 tablet by mouth daily. 1 Package 2  . clonazePAM (KLONOPIN) 0.5 MG tablet Take 1 tablet (0.5 mg total) by mouth 2 (two) times daily as needed for anxiety (or prn insomnia). (Patient not taking: Reported on 03/13/2016) 60 tablet 2   No facility-administered medications prior to visit.     ROS Review of Systems  Constitutional: Negative for activity change, appetite change, chills, fatigue and unexpected weight change.  HENT: Negative for congestion, mouth sores and sinus pressure.   Eyes: Negative for visual disturbance.  Respiratory: Negative for cough and chest tightness.   Gastrointestinal: Negative for abdominal pain and nausea.  Genitourinary: Negative for difficulty urinating, frequency and vaginal pain.  Musculoskeletal: Negative for back pain and gait problem.  Skin: Negative for pallor and rash.  Neurological: Negative for dizziness, tremors, weakness, numbness and headaches.  Psychiatric/Behavioral: Positive for sleep disturbance. Negative for  confusion.    Objective:  BP 124/74   Pulse 66   Temp 98.6 F (37 C) (Oral)   Resp 20   Wt 154 lb (69.9 kg)   SpO2 95%   BMI 27.28 kg/m   BP Readings from Last 3 Encounters:  03/13/16 124/74  01/11/16 120/76  01/05/16 120/76    Wt Readings from Last 3 Encounters:  03/13/16 154 lb (69.9 kg)  03/18/15 153 lb (69.4 kg)  02/23/15 151 lb (68.5 kg)    Physical Exam  Constitutional: She appears well-developed. No distress.  HENT:  Head: Normocephalic.  Right Ear: External ear normal.  Left Ear: External ear normal.  Nose: Nose normal.  Mouth/Throat: Oropharynx is clear and moist.  Eyes: Conjunctivae are normal. Pupils are equal, round, and reactive to light. Right eye exhibits no discharge. Left eye exhibits no discharge.  Neck: Normal range of motion. Neck supple. No JVD present. No tracheal deviation present. No thyromegaly present.  Cardiovascular: Normal rate, regular rhythm and normal heart sounds.   Pulmonary/Chest: No stridor. No respiratory distress. She has no wheezes.  Abdominal: Soft. Bowel sounds are normal. She exhibits no distension and no mass. There is no tenderness. There is no rebound and no guarding.  Musculoskeletal: She exhibits no edema or tenderness.  Lymphadenopathy:    She has no cervical adenopathy.  Neurological: She displays normal reflexes. No cranial nerve deficit. She exhibits normal muscle tone. Coordination normal.  Skin: No rash noted. No erythema.  Psychiatric: She has a normal mood and affect. Her behavior is normal.  Judgment and thought content normal.    Lab Results  Component Value Date   WBC 8.1 01/05/2016   HGB 12.2 01/05/2016   HCT 36.4 01/05/2016   PLT 371 01/05/2016   GLUCOSE 85 03/21/2015   CHOL 165 03/21/2015   TRIG 78 03/21/2015   HDL 57 03/21/2015   LDLCALC 92 03/21/2015   ALT 17 03/21/2015   AST 22 03/21/2015   NA 139 03/21/2015   K 4.3 03/21/2015   CL 105 03/21/2015   CREATININE 0.69 03/21/2015   BUN 10  03/21/2015   CO2 25 03/21/2015   TSH 0.94 01/05/2016    Mm Digital Screening Bilateral  Result Date: 08/31/2015 CLINICAL DATA:  Screening. EXAM: DIGITAL SCREENING BILATERAL MAMMOGRAM WITH CAD COMPARISON:  Previous exam(s). ACR Breast Density Category c: The breast tissue is heterogeneously dense, which may obscure small masses. FINDINGS: There are no findings suspicious for malignancy. Images were processed with CAD. IMPRESSION: No mammographic evidence of malignancy. A result letter of this screening mammogram will be mailed directly to the patient. RECOMMENDATION: Screening mammogram in one year. (Code:SM-B-01Y) BI-RADS CATEGORY  1: Negative. Electronically Signed   By: Franki Cabot M.D.   On: 08/31/2015 15:57    Assessment & Plan:   There are no diagnoses linked to this encounter. I am having Ms. Azure maintain her ferrous sulfate, multivitamin, clonazePAM, halobetasol, clindamycin, megestrol, norethindrone-ethinyl estradiol, and misoprostol.  No orders of the defined types were placed in this encounter.    Follow-up: No Follow-up on file.  Walker Kehr, MD

## 2016-03-13 NOTE — Assessment & Plan Note (Signed)
Resolved

## 2016-03-13 NOTE — Assessment & Plan Note (Addendum)
Wellbutrin SR - low dow

## 2016-03-13 NOTE — Assessment & Plan Note (Signed)
Zolpidem prn  Potential benefits of a long term benzodiazepines  use as well as potential risks  and complications were explained to the patient and were aknowledged. 

## 2016-03-13 NOTE — Progress Notes (Signed)
Pre visit review using our clinic review tool, if applicable. No additional management support is needed unless otherwise documented below in the visit note. 

## 2016-03-16 ENCOUNTER — Ambulatory Visit (INDEPENDENT_AMBULATORY_CARE_PROVIDER_SITE_OTHER): Payer: BLUE CROSS/BLUE SHIELD | Admitting: Gynecology

## 2016-03-16 ENCOUNTER — Encounter: Payer: Self-pay | Admitting: Gynecology

## 2016-03-16 VITALS — BP 118/76

## 2016-03-16 DIAGNOSIS — N921 Excessive and frequent menstruation with irregular cycle: Secondary | ICD-10-CM | POA: Diagnosis not present

## 2016-03-16 MED ORDER — DIAZEPAM 5 MG PO TABS: 5.0000 mg | ORAL_TABLET | Freq: Four times a day (QID) | ORAL | 0 refills | Status: DC | PRN

## 2016-03-16 MED ORDER — AZITHROMYCIN 500 MG PO TABS: ORAL_TABLET | ORAL | 0 refills | Status: DC

## 2016-03-16 MED ORDER — OXYCODONE-ACETAMINOPHEN 5-325 MG PO TABS: 1.0000 | ORAL_TABLET | ORAL | 0 refills | Status: DC | PRN

## 2016-03-16 NOTE — Patient Instructions (Signed)
Follow up for the endometrial ablation as scheduled. 

## 2016-03-16 NOTE — Progress Notes (Signed)
    Kelly Lawrence 1966-02-08 [id number]        51 y.o.  G2P2 presents preoperatively for her upcoming HerOption endometrial ablation.  Continues with heavy bleeding with negative sonohysterogram and negative endometrial biopsy showing proliferative endometrium. Recent hemoglobin 12 with normal TSH.  Recent FSH 35. Options for management of her bleeding were reviewed to include hormonal manipulation to include progesterone only or oral contraceptives. Lysteda, Mirena IUD, endometrial ablation, hysterectomy. Patient has decided she wants to proceed with endometrial ablation. Currently on Megace. Vasectomy birth control.  Past medical history,surgical history, problem list, medications, allergies, family history and social history were all reviewed and documented in the EPIC chart.  Directed ROS with pertinent positives and negatives documented in the history of present illness/assessment and plan.  Exam: [name] assistant Vitals:   03/16/16 1153  BP: 118/76   General appearance:  Normal Abdomen soft nontender without masses Pelvic external BUS vagina with slight menstrual flow. Cervix normal. Uterus grossly normal size midline mobile nontender. Adnexa without masses or tenderness.  Assessment/Plan:  51 y.o. G2P2 with unacceptable menorrhagia initially controlled with oral contraceptives but now heavy again despite. Currently on Megace for menstrual suppression. Alternative options for management reviewed as above and patient elects for endometrial ablation. I reviewed with her in detail the procedure to include the preoperative and postoperative medications including Cytotec the night before vaginally, oxycodone, Valium, antibiotics.  I discussed the success rates and failure risks of the procedure. She knows her no guarantees that this will relieve her menorrhagia and that she may need further treatments after the procedure. She also understands most women continue to have menses afterwards  but hopefully much lighter. She also understands she should never pursue pregnancy following the procedure. Currently using vasectomy but understands if other situations present that she needs to use assured contraception.  The risks of the procedure were reviewed to include damage to internal structures including vagina cervix uterus. Damage to internal organs either through trans-uterine cryo-damage or perforation with direct damage to bowel bladder ureters vessels and nerves necessitating major exploratory reparative surgeries. The risks of hemorrhage following the procedure necessitating transfusion and the risks of transfusion reviewed. The possibility of infection following the procedure and the need for prolonged antibiotics was discussed. Patient read through our consent form and signed it and it will be scanned into Epic. The patient's questions were answered to her satisfaction and she will present next week for the procedure. She'll stay on Megace until then. Azithromycin 500 mg tablet morning of and day after the procedure, Valium 5 mg   #10, oxycodone/acetaminophen 5/25 #15 all provided.    Anastasio Auerbach MD, 12:31 PM 03/16/2016

## 2016-03-17 ENCOUNTER — Other Ambulatory Visit: Payer: Self-pay | Admitting: Gynecology

## 2016-03-21 ENCOUNTER — Ambulatory Visit (INDEPENDENT_AMBULATORY_CARE_PROVIDER_SITE_OTHER): Payer: BLUE CROSS/BLUE SHIELD | Admitting: Gynecology

## 2016-03-21 ENCOUNTER — Encounter: Payer: Self-pay | Admitting: Gynecology

## 2016-03-21 ENCOUNTER — Ambulatory Visit (INDEPENDENT_AMBULATORY_CARE_PROVIDER_SITE_OTHER): Payer: BLUE CROSS/BLUE SHIELD

## 2016-03-21 ENCOUNTER — Ambulatory Visit: Payer: Self-pay | Admitting: Gynecology

## 2016-03-21 VITALS — BP 118/70

## 2016-03-21 DIAGNOSIS — N921 Excessive and frequent menstruation with irregular cycle: Secondary | ICD-10-CM

## 2016-03-21 DIAGNOSIS — R102 Pelvic and perineal pain: Secondary | ICD-10-CM

## 2016-03-21 HISTORY — PX: ENDOMETRIAL ABLATION: SHX621

## 2016-03-21 MED ORDER — KETOROLAC TROMETHAMINE 30 MG/ML IJ SOLN
60.0000 mg | Freq: Once | INTRAMUSCULAR | Status: AC
  Administered 2016-03-21: 60 mg via INTRAMUSCULAR

## 2016-03-21 MED ORDER — LIDOCAINE HCL 1 % IJ SOLN
10.0000 mL | Freq: Once | INTRAMUSCULAR | Status: AC
  Administered 2016-03-21: 10 mL

## 2016-03-21 NOTE — Progress Notes (Signed)
HER OPTION ENDOMETRIAL ABLATION PROCEDURAL NOTE    Kelly Lawrence 31-Dec-1965 [id number]   03/21/2016  Diagnosis:  Excessive uterine bleeding / Menorrhagia  Procedure:  Endometrial cryoablation with intraoperative ultrasonic guidance  Procedure:  The patient was brought to the treatment room having previously been counseled for the procedure and having signed the consent form that is scanned into Epic. Pre procedural medications received were Valium 5 mg in the morning, oxycodone 5/325 one by mouth prior to the procedure and Toradol 30 mg IM.  The patient was placed in the dorsal lithotomy position and a speculum was inserted. The cervix and upper vagina were cleansed with Betadine. A single-tooth tenaculum was placed on the anterior lip of the cervix. A  paracervical block was placed Yes.   using 10 cc's of 1% lidocaine.  The uterus was Yes.   sounded 9 centimeters. Cervical dilatation performed No. . Under ultrasound guidance, the Her Option probe was introduced into the uterine cavity after the preprocedural sequence was performed. After assuring proper cornual placement, cryoablation was then performed under continuous ultrasound guidance monitoring the growth of the cryo-zone. Sequential cryoablation's were performed in the following order:   Location   Length of Time Myometrial Depth 1. Right cornua   6 minutes  13.7 mm 2. Left cornua   6 minutes  8.7 mm  Upon completion of the procedure the instruments were removed, hemostasis visualized the patient was assisted to the bathroom and then to another exam room where she was observed. The patient tolerated the procedure well and was released in stable condition with her driver along with a copy of the postprocedural instructions and precautions which were reviewed with her. She is to return to the office in 2 weeks for post procedural check and received an appointment date and time.    Anastasio Auerbach MD, 11:38 AM 03/21/2016

## 2016-03-21 NOTE — Patient Instructions (Addendum)
Follow up in 2 weeks for your postoperative visit 

## 2016-04-04 ENCOUNTER — Ambulatory Visit (INDEPENDENT_AMBULATORY_CARE_PROVIDER_SITE_OTHER): Payer: BLUE CROSS/BLUE SHIELD | Admitting: Gynecology

## 2016-04-04 ENCOUNTER — Encounter: Payer: Self-pay | Admitting: Gynecology

## 2016-04-04 VITALS — BP 118/74

## 2016-04-04 DIAGNOSIS — Z9889 Other specified postprocedural states: Secondary | ICD-10-CM

## 2016-04-04 NOTE — Progress Notes (Signed)
    Kelly Lawrence Mar 15, 1965 [id number]        51 y.o.  G2P2 Presents postoperatively from HerOption endometrial ablation 2 weeks ago. Has done well without complaints. Scant discharge.  Past medical history,surgical history, problem list, medications, allergies, family history and social history were all reviewed and documented in the EPIC chart.  Directed ROS with pertinent positives and negatives documented in the history of present illness/assessment and plan.  Exam: [name] assistant Vitals:   04/04/16 1432  BP: 118/74   General appearance:  Normal Abdomen soft nontender without masses guarding rebound Pelvic external BUS vagina normal. Cervix normal. Uterus normal size midline mobile nontender. Adnexa without masses or tenderness.  Assessment/Plan:  51 y.o. G2P2 with normal postoperative visit status post HerOption endometrial ablation. We'll keep a menstrual calendar in follow up if she is continuing to be an issue. Otherwise will follow up and she is due for her annual exam.    Anastasio Auerbach MD, 2:40 PM 04/04/2016

## 2016-04-04 NOTE — Patient Instructions (Addendum)
Follow up if any issues with your periods.

## 2016-04-25 ENCOUNTER — Ambulatory Visit (INDEPENDENT_AMBULATORY_CARE_PROVIDER_SITE_OTHER): Payer: BLUE CROSS/BLUE SHIELD | Admitting: Internal Medicine

## 2016-04-25 ENCOUNTER — Encounter: Payer: Self-pay | Admitting: Internal Medicine

## 2016-04-25 DIAGNOSIS — F411 Generalized anxiety disorder: Secondary | ICD-10-CM

## 2016-04-25 MED ORDER — BUPROPION HCL ER (SR) 100 MG PO TB12: 100.0000 mg | ORAL_TABLET | Freq: Two times a day (BID) | ORAL | 5 refills | Status: DC

## 2016-04-25 NOTE — Assessment & Plan Note (Signed)
Increase Wellbutrin SR bid

## 2016-04-25 NOTE — Progress Notes (Signed)
   Subjective:  Patient ID: Kelly Lawrence, female    DOB: 1965-06-30  Age: 51 y.o. MRN: [id number]  CC: No chief complaint on file.   HPI Kelly Lawrence presents for anxiety f/u C/o distal lat thigh discomfort due to running  Outpatient Medications Prior to Visit  Medication Sig Dispense Refill  . buPROPion (WELLBUTRIN SR) 100 MG 12 hr tablet Take 1 tablet (100 mg total) by mouth every morning. 30 tablet 5  . Cholecalciferol (VITAMIN D3) 2000 units capsule Take 1 capsule (2,000 Units total) by mouth daily. 100 capsule 3  . clindamycin (CLEOCIN T) 1 % lotion Apply topically 2 (two) times daily. 60 mL 0  . Multiple Vitamin (MULTIVITAMIN) tablet Take 1 tablet by mouth daily.     Marland Kitchen zolpidem (AMBIEN) 5 MG tablet Take 1 tablet (5 mg total) by mouth at bedtime as needed for sleep. 30 tablet 1   No facility-administered medications prior to visit.     ROS Review of Systems  Objective:  BP (!) 102/58   Pulse (!) 59   Temp 97.9 F (36.6 C)   Wt 155 lb (70.3 kg)   SpO2 98%   BMI 27.46 kg/m   BP Readings from Last 3 Encounters:  04/25/16 (!) 102/58  04/04/16 118/74  03/21/16 118/70    Wt Readings from Last 3 Encounters:  04/25/16 155 lb (70.3 kg)  03/13/16 154 lb (69.9 kg)  03/18/15 153 lb (69.4 kg)    Physical Exam  Lab Results  Component Value Date   WBC 8.1 01/05/2016   HGB 12.2 01/05/2016   HCT 36.4 01/05/2016   PLT 371 01/05/2016   GLUCOSE 85 03/21/2015   CHOL 165 03/21/2015   TRIG 78 03/21/2015   HDL 57 03/21/2015   LDLCALC 92 03/21/2015   ALT 17 03/21/2015   AST 22 03/21/2015   NA 139 03/21/2015   K 4.3 03/21/2015   CL 105 03/21/2015   CREATININE 0.69 03/21/2015   BUN 10 03/21/2015   CO2 25 03/21/2015   TSH 0.94 01/05/2016    Mm Digital Screening Bilateral  Result Date: 08/26/2015 CLINICAL DATA:  Screening. EXAM: DIGITAL SCREENING BILATERAL MAMMOGRAM WITH CAD COMPARISON:  Previous exam(s). ACR Breast Density Category c: The breast tissue is  heterogeneously dense, which may obscure small masses. FINDINGS: There are no findings suspicious for malignancy. Images were processed with CAD. IMPRESSION: No mammographic evidence of malignancy. A result letter of this screening mammogram will be mailed directly to the patient. RECOMMENDATION: Screening mammogram in one year. (Code:SM-B-01Y) BI-RADS CATEGORY  1: Negative. Electronically Signed   By: Franki Cabot M.D.   On: 08/31/2015 15:57    Assessment & Plan:   There are no diagnoses linked to this encounter. I am having Ms. Rampone maintain her multivitamin, clindamycin, Vitamin D3, zolpidem, and buPROPion.  No orders of the defined types were placed in this encounter.    Follow-up: No Follow-up on file.  Walker Kehr, MD

## 2016-04-25 NOTE — Progress Notes (Signed)
Pre visit review using our clinic review tool, if applicable. No additional management support is needed unless otherwise documented below in the visit note. 

## 2016-04-25 NOTE — Patient Instructions (Signed)
IT band stretch 

## 2016-04-26 ENCOUNTER — Encounter: Payer: Self-pay | Admitting: Internal Medicine

## 2016-05-23 ENCOUNTER — Encounter: Payer: Self-pay | Admitting: Gynecology

## 2016-07-11 ENCOUNTER — Encounter: Payer: Self-pay | Admitting: Gynecology

## 2016-07-16 ENCOUNTER — Encounter: Payer: Self-pay | Admitting: Gynecology

## 2016-07-18 NOTE — Telephone Encounter (Signed)
I tell patients about 6 months to see what the periods will be like

## 2016-08-24 ENCOUNTER — Ambulatory Visit (INDEPENDENT_AMBULATORY_CARE_PROVIDER_SITE_OTHER): Payer: BLUE CROSS/BLUE SHIELD | Admitting: Gynecology

## 2016-08-24 ENCOUNTER — Encounter: Payer: Self-pay | Admitting: Internal Medicine

## 2016-08-24 ENCOUNTER — Encounter: Payer: Self-pay | Admitting: Gynecology

## 2016-08-24 ENCOUNTER — Ambulatory Visit (INDEPENDENT_AMBULATORY_CARE_PROVIDER_SITE_OTHER): Payer: BLUE CROSS/BLUE SHIELD | Admitting: Internal Medicine

## 2016-08-24 VITALS — BP 110/66 | HR 55 | Temp 98.3°F | Ht 63.0 in | Wt 154.0 lb

## 2016-08-24 VITALS — BP 128/86 | Ht 63.0 in | Wt 154.0 lb

## 2016-08-24 DIAGNOSIS — Z01419 Encounter for gynecological examination (general) (routine) without abnormal findings: Secondary | ICD-10-CM | POA: Diagnosis not present

## 2016-08-24 DIAGNOSIS — R002 Palpitations: Secondary | ICD-10-CM | POA: Diagnosis not present

## 2016-08-24 DIAGNOSIS — L559 Sunburn, unspecified: Secondary | ICD-10-CM | POA: Diagnosis not present

## 2016-08-24 DIAGNOSIS — G4709 Other insomnia: Secondary | ICD-10-CM | POA: Diagnosis not present

## 2016-08-24 DIAGNOSIS — Z23 Encounter for immunization: Secondary | ICD-10-CM

## 2016-08-24 DIAGNOSIS — N920 Excessive and frequent menstruation with regular cycle: Secondary | ICD-10-CM

## 2016-08-24 DIAGNOSIS — F411 Generalized anxiety disorder: Secondary | ICD-10-CM

## 2016-08-24 LAB — CBC WITH DIFFERENTIAL/PLATELET
BASOS PCT: 1 %
Basophils Absolute: 72 cells/uL (ref 0–200)
EOS PCT: 4 %
Eosinophils Absolute: 288 cells/uL (ref 15–500)
HCT: 39.4 % (ref 35.0–45.0)
Hemoglobin: 13.1 g/dL (ref 11.7–15.5)
LYMPHS PCT: 32 %
Lymphs Abs: 2304 cells/uL (ref 850–3900)
MCH: 30.7 pg (ref 27.0–33.0)
MCHC: 33.2 g/dL (ref 32.0–36.0)
MCV: 92.3 fL (ref 80.0–100.0)
MONOS PCT: 8 %
MPV: 9.7 fL (ref 7.5–12.5)
Monocytes Absolute: 576 cells/uL (ref 200–950)
Neutro Abs: 3960 cells/uL (ref 1500–7800)
Neutrophils Relative %: 55 %
PLATELETS: 355 10*3/uL (ref 140–400)
RBC: 4.27 MIL/uL (ref 3.80–5.10)
RDW: 13.2 % (ref 11.0–15.0)
WBC: 7.2 10*3/uL (ref 3.8–10.8)

## 2016-08-24 MED ORDER — NORETHINDRONE ACET-ETHINYL EST 1-20 MG-MCG PO TABS: 1.0000 | ORAL_TABLET | Freq: Every day | ORAL | 4 refills | Status: DC

## 2016-08-24 MED ORDER — BUPROPION HCL ER (XL) 150 MG PO TB24: 150.0000 mg | ORAL_TABLET | Freq: Every day | ORAL | 1 refills | Status: DC

## 2016-08-24 NOTE — Addendum Note (Signed)
Addended by: Thurnell Garbe A on: 08/24/2016 12:27 PM   Modules accepted: Orders

## 2016-08-24 NOTE — Assessment & Plan Note (Signed)
Chronic. 

## 2016-08-24 NOTE — Assessment & Plan Note (Addendum)
Wellbutrin SR bid changed to Wellbutrin XL 150 mg/d

## 2016-08-24 NOTE — Patient Instructions (Signed)
Start on the birth control pills as we discussed. Call me if you have any issues with this.  Juliann Pulse will call you to start arranging for surgery in December.

## 2016-08-24 NOTE — Progress Notes (Signed)
Subjective:  Patient ID: Kelly Lawrence, female    DOB: Feb 06, 1966  Age: 51 y.o. MRN: [id number]  CC: No chief complaint on file.   HPI Kelly Lawrence presents for anxiety, palpitations f/u. No palpitations when running  Outpatient Medications Prior to Visit  Medication Sig Dispense Refill  . buPROPion (WELLBUTRIN SR) 100 MG 12 hr tablet Take 1 tablet (100 mg total) by mouth 2 (two) times daily. 60 tablet 5  . Cholecalciferol (VITAMIN D3) 2000 units capsule Take 1 capsule (2,000 Units total) by mouth daily. 100 capsule 3  . clindamycin (CLEOCIN T) 1 % lotion Apply topically 2 (two) times daily. 60 mL 0  . Multiple Vitamin (MULTIVITAMIN) tablet Take 1 tablet by mouth daily.     Marland Kitchen zolpidem (AMBIEN) 5 MG tablet Take 1 tablet (5 mg total) by mouth at bedtime as needed for sleep. 30 tablet 1   No facility-administered medications prior to visit.     ROS Review of Systems  Constitutional: Negative for activity change, appetite change, chills, fatigue and unexpected weight change.  HENT: Negative for congestion, mouth sores and sinus pressure.   Eyes: Negative for visual disturbance.  Respiratory: Negative for cough, chest tightness and shortness of breath.   Cardiovascular: Positive for palpitations.  Gastrointestinal: Negative for abdominal pain and nausea.  Genitourinary: Negative for difficulty urinating, frequency and vaginal pain.  Musculoskeletal: Negative for back pain and gait problem.  Skin: Negative for pallor and rash.  Neurological: Negative for dizziness, tremors, weakness, numbness and headaches.  Psychiatric/Behavioral: Negative for confusion and sleep disturbance. The patient is nervous/anxious.     Objective:  BP 110/66 (BP Location: Left Arm, Patient Position: Sitting, Cuff Size: Normal)   Pulse (!) 55   Temp 98.3 F (36.8 C) (Oral)   Ht 5\' 3"  (1.6 m)   Wt 154 lb (69.9 kg)   SpO2 99%   BMI 27.28 kg/m   BP Readings from Last 3 Encounters:  08/24/16 110/66   04/25/16 (!) 102/58  04/04/16 118/74    Wt Readings from Last 3 Encounters:  08/24/16 154 lb (69.9 kg)  04/25/16 155 lb (70.3 kg)  03/13/16 154 lb (69.9 kg)    Physical Exam  Constitutional: She appears well-developed. No distress.  HENT:  Head: Normocephalic.  Right Ear: External ear normal.  Left Ear: External ear normal.  Nose: Nose normal.  Mouth/Throat: Oropharynx is clear and moist.  Eyes: Conjunctivae are normal. Pupils are equal, round, and reactive to light. Right eye exhibits no discharge. Left eye exhibits no discharge.  Neck: Normal range of motion. Neck supple. No JVD present. No tracheal deviation present. No thyromegaly present.  Cardiovascular: Normal rate, regular rhythm and normal heart sounds.   Pulmonary/Chest: No stridor. No respiratory distress. She has no wheezes.  Abdominal: Soft. Bowel sounds are normal. She exhibits no distension and no mass. There is no tenderness. There is no rebound and no guarding.  Musculoskeletal: She exhibits no edema or tenderness.  Lymphadenopathy:    She has no cervical adenopathy.  Neurological: She displays normal reflexes. No cranial nerve deficit. She exhibits normal muscle tone. Coordination normal.  Skin: No rash noted. There is erythema.  Psychiatric: She has a normal mood and affect. Her behavior is normal. Judgment and thought content normal.  sunburn on shoulders  Lab Results  Component Value Date   WBC 8.1 01/05/2016   HGB 12.2 01/05/2016   HCT 36.4 01/05/2016   PLT 371 01/05/2016   GLUCOSE 85 03/21/2015  CHOL 165 03/21/2015   TRIG 78 03/21/2015   HDL 57 03/21/2015   LDLCALC 92 03/21/2015   ALT 17 03/21/2015   AST 22 03/21/2015   NA 139 03/21/2015   K 4.3 03/21/2015   CL 105 03/21/2015   CREATININE 0.69 03/21/2015   BUN 10 03/21/2015   CO2 25 03/21/2015   TSH 0.94 01/05/2016    Mm Digital Screening Bilateral  Result Date: 08/26/2015 CLINICAL DATA:  Screening. EXAM: DIGITAL SCREENING BILATERAL  MAMMOGRAM WITH CAD COMPARISON:  Previous exam(s). ACR Breast Density Category c: The breast tissue is heterogeneously dense, which may obscure small masses. FINDINGS: There are no findings suspicious for malignancy. Images were processed with CAD. IMPRESSION: No mammographic evidence of malignancy. A result letter of this screening mammogram will be mailed directly to the patient. RECOMMENDATION: Screening mammogram in one year. (Code:SM-B-01Y) BI-RADS CATEGORY  1: Negative. Electronically Signed   By: Franki Cabot M.D.   On: 08/31/2015 15:57    Assessment & Plan:   There are no diagnoses linked to this encounter. I am having Ms. Henry maintain her multivitamin, clindamycin, Vitamin D3, zolpidem, and buPROPion.  No orders of the defined types were placed in this encounter.    Follow-up: No Follow-up on file.  Walker Kehr, MD

## 2016-08-24 NOTE — Assessment & Plan Note (Signed)
OTC Hydrocortisone cream tid

## 2016-08-24 NOTE — Progress Notes (Signed)
    Kelly Lawrence 11-Aug-1965 [id number]        51 y.o.  G2P2 for annual exam.  Had endometrial ablation in January but unfortunate has continued to have heavy menses since then. Started bleeding twice in June.  Past medical history,surgical history, problem list, medications, allergies, family history and social history were all reviewed and documented as reviewed in the EPIC chart.  ROS:  Performed with pertinent positives and negatives included in the history, assessment and plan.   Additional significant findings :  None   Exam: Copywriter, advertising Vitals:   08/24/16 1125  BP: 128/86  Weight: 154 lb (69.9 kg)  Height: 5\' 3"  (1.6 m)   Body mass index is 27.28 kg/m.  General appearance:  Normal affect, orientation and appearance. Skin: Grossly normal HEENT: Without gross lesions.  No cervical or supraclavicular adenopathy. Thyroid normal.  Lungs:  Clear without wheezing, rales or rhonchi Cardiac: RR, without RMG Abdominal:  Soft, nontender, without masses, guarding, rebound, organomegaly or hernia Breasts:  Examined lying and sitting without masses, retractions, discharge or axillary adenopathy. Pelvic:  Ext, BUS, Vagina: Normal with menses flow  Cervix: Normal. Pap smear done  Uterus: Anteverted, normal size, shape and contour, midline and mobile nontender   Adnexa: Without masses or tenderness    Anus and perineum: Normal   Rectovaginal: Normal sphincter tone without palpated masses or tenderness.    Assessment/Plan:  51 y.o. G2P2 female for annual exam with regular menses, vasectomy birth control.   1. Menorrhagia. Status post ablation which unfortunately does not appear to have worked. Reviewed possibilities to include adenomyosis with her. Options for ongoing management reviewed to include hormonal manipulation, Mirena IUD, repeat ablation, hysterectomy. Patient interested in moving towards hysterectomy as definitive treatment. Ovarian conservation issues reviewed at age  89. Benefits of keeping her ovaries for a more natural transition through menopause versus risks to include ovarian disease in the future both benign and ovarian cancer. At this point the patient wants to keep both ovaries accepting the risks of ovarian disease in the future. Will plan on bilateral salpingectomies as risk reductive surgeries per SGOT recommendations. At this point patient elects to start on low-dose oral contraceptives continuously for menstrual suppression. Risks particularly at age 60 reviewed to include increased risk of thrombosis such as stroke heart attack DVT. She's never smoked and is not being followed for any medical issues. She accepts the risks will go ahead and start on Loestrin 1/20 equivalent #2 packs with 4 refills provided. Will move toward scheduling her surgery which she is looking forward to December. Check baseline CBC today 2. Mammography due now and patients going to schedule this. SBE monthly reviewed. 3. Pap smear 2014. Pap smear done today. No history of abnormal Pap smears previously. 4. Health maintenance. Patient reports routine lab work done elsewhere. Patient will follow up preoperatively for surgery, sooner if any issues once starting the birth control pills.   Anastasio Auerbach MD, 11:59 AM 08/24/2016

## 2016-08-24 NOTE — Assessment & Plan Note (Signed)
Zolpidem - rare

## 2016-08-28 ENCOUNTER — Telehealth: Payer: Self-pay

## 2016-08-28 NOTE — Telephone Encounter (Signed)
I called patient to let her know I received Dr. Loetta Rough order for surgery for December. We reviewed her ins benefits and estimated surgery preymt as of today.  I will call her as soon as I get the Dec schedule and she would like sometime after the 2nd week in Dec.

## 2016-08-30 LAB — PAP IG W/ RFLX HPV ASCU

## 2016-09-07 ENCOUNTER — Ambulatory Visit: Payer: Self-pay | Admitting: Internal Medicine

## 2016-10-30 DIAGNOSIS — H5213 Myopia, bilateral: Secondary | ICD-10-CM | POA: Diagnosis not present

## 2016-11-01 ENCOUNTER — Encounter: Payer: Self-pay | Admitting: Internal Medicine

## 2016-11-01 ENCOUNTER — Encounter: Payer: Self-pay | Admitting: Gynecology

## 2016-11-15 ENCOUNTER — Other Ambulatory Visit: Payer: Self-pay | Admitting: Gynecology

## 2016-11-15 DIAGNOSIS — Z1231 Encounter for screening mammogram for malignant neoplasm of breast: Secondary | ICD-10-CM

## 2016-11-27 ENCOUNTER — Ambulatory Visit
Admission: RE | Admit: 2016-11-27 | Discharge: 2016-11-27 | Disposition: A | Discharge status: Home / Self Care | Payer: BLUE CROSS/BLUE SHIELD | Source: Ambulatory Visit | Attending: Gynecology | Admitting: Gynecology

## 2016-11-27 DIAGNOSIS — Z1231 Encounter for screening mammogram for malignant neoplasm of breast: Secondary | ICD-10-CM | POA: Diagnosis not present

## 2016-11-28 ENCOUNTER — Telehealth: Payer: Self-pay

## 2016-11-28 NOTE — Telephone Encounter (Signed)
Patient returned my call. I had left message last week and again today.  She called today and left message saying that she is waiting a few weeks. She is waiting to see if her ins will change with new year and also may be having a new job opportunity. She still wants surgery but will have to wait a few weeks before calling to schedule.

## 2017-01-25 ENCOUNTER — Encounter: Payer: Self-pay | Admitting: Gynecology

## 2017-01-28 ENCOUNTER — Telehealth: Payer: Self-pay

## 2017-01-28 MED ORDER — PHENAZOPYRIDINE HCL 200 MG PO TABS: ORAL_TABLET | ORAL | 0 refills | Status: DC

## 2017-01-28 NOTE — Telephone Encounter (Addendum)
Patient called to say she ready to schedule surgery mid to late Jan. We discussed dates and she picked 03/11/16 7:30am at [facility].  Butch Penny will call her today to schedule pre op visit with Dr. Loetta Rough. We discussed her insurance benefits and her estimated surgery prepymt amount and I will send her a financial letter as well as a pamphlet from [facility].  We discussed need for Pyridum night before and morning of surgery and that Rx will be sent today.   We discussed that she has disability forms to be completed. I advised her regarding need to sign med rec release form and fee.

## 2017-01-29 NOTE — Telephone Encounter (Signed)
Per Dr. Loetta Rough staff message Surgery procedure changed to LAVH, Bilateral Salpingectomies.

## 2017-01-31 ENCOUNTER — Encounter: Payer: Self-pay | Admitting: Gynecology

## 2017-02-11 ENCOUNTER — Encounter: Payer: Self-pay | Admitting: Gynecology

## 2017-02-21 ENCOUNTER — Encounter: Payer: Self-pay | Admitting: Gynecology

## 2017-02-25 NOTE — Telephone Encounter (Signed)
Surgery is medically necessary and 6 weeks is okay

## 2017-02-25 NOTE — Telephone Encounter (Signed)
Dr. Theora Master to provide note stating medically necessary for surgery?  Patient has already had to reschedule once trying to accomodate her employer.

## 2017-03-08 ENCOUNTER — Ambulatory Visit: Payer: BLUE CROSS/BLUE SHIELD | Admitting: Gynecology

## 2017-03-10 ENCOUNTER — Other Ambulatory Visit: Payer: Self-pay | Admitting: Gynecology

## 2017-03-12 ENCOUNTER — Encounter: Payer: Self-pay | Admitting: Gynecology

## 2017-03-25 NOTE — Patient Instructions (Addendum)
Your procedure is scheduled on: Tuesday April 02, 2017 at 7:30 am  Enter through the Main Entrance of [facility] at: 6:00 am  Pick up the phone at the desk and dial [phone].  Call this number if you have problems the morning of surgery: [phone].  Remember: Do NOT eat food or drink any liquids after: Midnight on Monday February 4:  Take these medicines the morning of surgery with a SIP OF WATER: Wellbutrin  Do NOT wear jewelry (body piercing), metal hair clips/bobby pins, make-up, or nail polish. Do NOT wear lotions, powders, or perfumes.  You may wear deoderant. Do NOT shave for 48 hours prior to surgery. Do NOT bring valuables to the hospital. Contacts, dentures, or bridgework may not be worn into surgery. Leave suitcase in car.  After surgery it may be brought to your room.  For patients admitted to the hospital, checkout time is 11:00 AM the day of discharge.

## 2017-03-27 ENCOUNTER — Encounter (HOSPITAL_COMMUNITY): Payer: Self-pay

## 2017-03-27 ENCOUNTER — Encounter: Payer: Self-pay | Admitting: Gynecology

## 2017-03-27 ENCOUNTER — Ambulatory Visit (INDEPENDENT_AMBULATORY_CARE_PROVIDER_SITE_OTHER): Payer: BLUE CROSS/BLUE SHIELD | Admitting: Gynecology

## 2017-03-27 ENCOUNTER — Encounter (HOSPITAL_COMMUNITY)
Admission: RE | Admit: 2017-03-27 | Discharge: 2017-03-27 | Disposition: A | Discharge status: Home / Self Care | Payer: BLUE CROSS/BLUE SHIELD | Source: Ambulatory Visit | Attending: Gynecology | Admitting: Gynecology

## 2017-03-27 ENCOUNTER — Other Ambulatory Visit: Payer: Self-pay

## 2017-03-27 VITALS — BP 124/74

## 2017-03-27 DIAGNOSIS — F419 Anxiety disorder, unspecified: Secondary | ICD-10-CM | POA: Insufficient documentation

## 2017-03-27 DIAGNOSIS — Z01812 Encounter for preprocedural laboratory examination: Secondary | ICD-10-CM | POA: Insufficient documentation

## 2017-03-27 DIAGNOSIS — J45909 Unspecified asthma, uncomplicated: Secondary | ICD-10-CM | POA: Insufficient documentation

## 2017-03-27 DIAGNOSIS — Z8249 Family history of ischemic heart disease and other diseases of the circulatory system: Secondary | ICD-10-CM | POA: Insufficient documentation

## 2017-03-27 DIAGNOSIS — N920 Excessive and frequent menstruation with regular cycle: Secondary | ICD-10-CM | POA: Diagnosis not present

## 2017-03-27 DIAGNOSIS — N924 Excessive bleeding in the premenopausal period: Secondary | ICD-10-CM | POA: Diagnosis not present

## 2017-03-27 DIAGNOSIS — Z833 Family history of diabetes mellitus: Secondary | ICD-10-CM | POA: Diagnosis not present

## 2017-03-27 DIAGNOSIS — Z9889 Other specified postprocedural states: Secondary | ICD-10-CM | POA: Insufficient documentation

## 2017-03-27 DIAGNOSIS — Z882 Allergy status to sulfonamides status: Secondary | ICD-10-CM | POA: Insufficient documentation

## 2017-03-27 DIAGNOSIS — G4700 Insomnia, unspecified: Secondary | ICD-10-CM | POA: Insufficient documentation

## 2017-03-27 LAB — CBC
HEMATOCRIT: 39.9 % (ref 36.0–46.0)
HEMOGLOBIN: 13.5 g/dL (ref 12.0–15.0)
MCH: 30.8 pg (ref 26.0–34.0)
MCHC: 33.8 g/dL (ref 30.0–36.0)
MCV: 90.9 fL (ref 78.0–100.0)
Platelets: 376 10*3/uL (ref 150–400)
RBC: 4.39 MIL/uL (ref 3.87–5.11)
RDW: 11.9 % (ref 11.5–15.5)
WBC: 10.7 10*3/uL — ABNORMAL HIGH (ref 4.0–10.5)

## 2017-03-27 NOTE — Patient Instructions (Signed)
Followup for surgery as scheduled. 

## 2017-03-27 NOTE — H&P (Signed)
Kelly Lawrence [birth year] [id number]   History and Physical  Chief complaint: Menorrhagia  History of present illness: 52 y.o. G2P2 with a history of menorrhagia to include double protection and bleedthrough episodes.  Negative sonohysterogram with negative endometrial biopsy.  Underwent endometrial ablation January 2018 with continued menorrhagia following.  Options for management reviewed to include hormonal manipulation up to and including hysterectomy and patient wants to proceed with hysterectomy.  Patient is admitted for LAVH BSO.  Past Medical History:  Diagnosis Date  . Anxiety   . Asthma   . Insomnia   . Leiomyoma 03/2013   20 x 14 mm on ultrasound    Past Surgical History:  Procedure Laterality Date  . broken arm    . ENDOMETRIAL ABLATION  03/21/2016   [facility]  . enterocele repair with uterosacral and cardinal ligament colposuspension  2010   Dr. Ubaldo Glassing operative report under notes in Epic  . WISDOM TOOTH EXTRACTION      Family History  Problem Relation Age of Onset  . Heart disease Father        obese  . Hypertension Father   . Diabetes Father     Social History:  reports that  has never smoked. she has never used smokeless tobacco. She reports that she does not drink alcohol or use drugs.  Allergies:  Allergies  Allergen Reactions  . Sulfonamide Derivatives     REACTION: rash/swelling    Medications: See epic for most up-to-date list  ROS:  Was performed and pertinent positives and negatives are included in the history of present illness.  Exam: [name] assistant Vitals:   03/27/17 1216  BP: 124/74   General: well developed, well nourished female, no acute distress HEENT: normal  Lungs: clear to auscultation without wheezing, rales or rhonchi  Cardiac: regular rate without rubs, murmurs or gallops  Abdomen: soft, nontender without masses, guarding, rebound, organomegaly  Pelvic: external bus vagina: normal   Cervix: grossly normal  Uterus:  normal size, midline and mobile, nontender  Adnexa: without masses or tenderness    Assessment/Plan:  52 y.o. G2P2 with menorrhagia despite endometrial ablation.  Currently on continuous oral contraceptives with menstrual suppression.  Options to continue hormonal suppression through menopause, trial of IUD, repeat endometrial ablation and hysterectomy reviewed.  Patient desires definitive treatment of her menorrhagia and wants to proceed with hysterectomy.  Various approaches to include TVH, LAVH and TAH were discussed.  She does have a history of a posterior rectocele and enterocele repair in the past and we felt it most prudent to proceed with LAVH for pelvic assessment.  Patient clearly understands that any time during the procedure we may convert the laparoscopic approach to a TAH with a larger incision and a longer recovery.  The absolute irreversible sterility of hysterectomy was reviewed with the patient.  Sexuality following hysterectomy was also discussed and the risks of persistent dyspareunia or persistent orgasmic dysfunction following surgery was reviewed.  The ovarian conservation issue was discussed with the patient at length and the options to keep both ovaries for ongoing hormonal production versus removing both ovaries.  Risks of keeping her ovaries to include ovarian disease in the future up to and including cancer discussed as well as the risks of removing her ovaries and the issues of hypoestrogenism.  Options for ERT were reviewed and the risks versus benefits to include increased risk of thrombosis such as stroke heart attack DVT and breast cancer issue was discussed versus benefits to include symptom relief  and possible cardiovascular and bone health when started early.  The patient strongly wants removed along with her fallopian tubes for risk reductive surgery as recommended by SGO.  We will tentatively plan on starting ERT following the surgery and continuing this for the next several  years and readdressing discontinuing in the future. The expected intraoperative and postoperative courses as well as the recovery period were reviewed. The risks of infection, prolonged antibiotics, reoperation for abscess or hematoma formation was discussed. The risks of hemorrhage necessitating transfusion and the risks of transfusion reaction, hepatitis, HIV, mad cow disease and other unknown entities was also discussed. Incisional complications to include opening and draining of incisions and closure by secondary intention, dehiscence and long-term issues of keloid/cosmetics and hernia formation were reviewed. The risk of inadvertent injury to internal organs including bowel, bladder, ureters, vessels, nerves either immediately recognized or delay recognized necessitating major exploratory reparative surgeries and future reparative surgeries including bowel resection, ostomy formation, bladder repair, ureteral damage repair was discussed with her. The patient's questions were answered to her satisfaction and she is ready to proceed with surgery.    Anastasio Auerbach MD, 2:00 PM 03/27/2017

## 2017-03-27 NOTE — Progress Notes (Signed)
Kelly Lawrence Jun 05, 1965 [id number]   Preoperative consult  Chief complaint: Menorrhagia  History of present illness: 52 y.o. G2P2 with a history of menorrhagia to include double protection and bleedthrough episodes.  Negative sonohysterogram with negative endometrial biopsy.  Underwent endometrial ablation January 2018 with continued menorrhagia following.  Options for management reviewed to include hormonal manipulation up to and including hysterectomy and patient wants to proceed with hysterectomy.  Patient is admitted for LAVH BSO.  Past medical history,surgical history, medications, allergies, family history and social history were all reviewed and documented in the EPIC chart.  ROS:  Was performed and pertinent positives and negatives are included in the history of present illness.  Exam: [name] assistant Vitals:   03/27/17 1216  BP: 124/74   General: well developed, well nourished female, no acute distress HEENT: normal  Lungs: clear to auscultation without wheezing, rales or rhonchi  Cardiac: regular rate without rubs, murmurs or gallops  Abdomen: soft, nontender without masses, guarding, rebound, organomegaly  Pelvic: external bus vagina: normal   Cervix: grossly normal  Uterus: normal size, midline and mobile, nontender  Adnexa: without masses or tenderness    Assessment/Plan:  52 y.o. G2P2 with menorrhagia despite endometrial ablation.  Currently on continuous oral contraceptives with menstrual suppression.  Options to continue hormonal suppression through menopause, trial of IUD, repeat endometrial ablation and hysterectomy reviewed.  Patient desires definitive treatment of her menorrhagia and wants to proceed with hysterectomy.  Various approaches to include TVH, LAVH and TAH were discussed.  She does have a history of a posterior rectocele and enterocele repair in the past and we felt it most prudent to proceed with LAVH for pelvic assessment.  Patient clearly  understands that any time during the procedure we may convert the laparoscopic approach to a TAH with a larger incision and a longer recovery.  The absolute irreversible sterility of hysterectomy was reviewed with the patient.  Sexuality following hysterectomy was also discussed and the risks of persistent dyspareunia or persistent orgasmic dysfunction following surgery was reviewed.  The ovarian conservation issue was discussed with the patient at length and the options to keep both ovaries for ongoing hormonal production versus removing both ovaries.  Risks of keeping her ovaries to include ovarian disease in the future up to and including cancer discussed as well as the risks of removing her ovaries and the issues of hypoestrogenism.  Options for ERT were reviewed and the risks versus benefits to include increased risk of thrombosis such as stroke heart attack DVT and breast cancer issue was discussed versus benefits to include symptom relief and possible cardiovascular and bone health when started early.  The patient strongly wants removed along with her fallopian tubes for risk reductive surgery as recommended by SGO.  We will tentatively plan on starting ERT following the surgery and continuing this for the next several years and readdressing discontinuing in the future. The expected intraoperative and postoperative courses as well as the recovery period were reviewed. The risks of infection, prolonged antibiotics, reoperation for abscess or hematoma formation was discussed. The risks of hemorrhage necessitating transfusion and the risks of transfusion reaction, hepatitis, HIV, mad cow disease and other unknown entities was also discussed. Incisional complications to include opening and draining of incisions and closure by secondary intention, dehiscence and long-term issues of keloid/cosmetics and hernia formation were reviewed. The risk of inadvertent injury to internal organs including bowel, bladder,  ureters, vessels, nerves either immediately recognized or delay recognized necessitating major exploratory reparative  surgeries and future reparative surgeries including bowel resection, ostomy formation, bladder repair, ureteral damage repair was discussed with her. The patient's questions were answered to her satisfaction and she is ready to proceed with surgery.    Anastasio Auerbach MD, 1:52 PM 03/27/2017

## 2017-03-28 DIAGNOSIS — Z0289 Encounter for other administrative examinations: Secondary | ICD-10-CM

## 2017-04-02 ENCOUNTER — Ambulatory Visit (HOSPITAL_COMMUNITY)
Admission: RE | Admit: 2017-04-02 | Discharge: 2017-04-03 | Disposition: A | Discharge status: Home / Self Care | Payer: BLUE CROSS/BLUE SHIELD | Source: Ambulatory Visit | Attending: Gynecology | Admitting: Gynecology

## 2017-04-02 ENCOUNTER — Ambulatory Visit (HOSPITAL_COMMUNITY): Payer: BLUE CROSS/BLUE SHIELD | Admitting: Anesthesiology

## 2017-04-02 ENCOUNTER — Encounter (HOSPITAL_COMMUNITY): Payer: Self-pay

## 2017-04-02 ENCOUNTER — Other Ambulatory Visit: Payer: Self-pay

## 2017-04-02 ENCOUNTER — Encounter (HOSPITAL_COMMUNITY)
Admission: RE | Disposition: A | Discharge status: Home / Self Care | Payer: Self-pay | Source: Ambulatory Visit | Attending: Gynecology

## 2017-04-02 DIAGNOSIS — D259 Leiomyoma of uterus, unspecified: Secondary | ICD-10-CM | POA: Diagnosis not present

## 2017-04-02 DIAGNOSIS — N921 Excessive and frequent menstruation with irregular cycle: Secondary | ICD-10-CM | POA: Diagnosis present

## 2017-04-02 DIAGNOSIS — Z79899 Other long term (current) drug therapy: Secondary | ICD-10-CM | POA: Insufficient documentation

## 2017-04-02 DIAGNOSIS — Z833 Family history of diabetes mellitus: Secondary | ICD-10-CM | POA: Diagnosis not present

## 2017-04-02 DIAGNOSIS — Z882 Allergy status to sulfonamides status: Secondary | ICD-10-CM | POA: Diagnosis not present

## 2017-04-02 DIAGNOSIS — R002 Palpitations: Secondary | ICD-10-CM | POA: Diagnosis not present

## 2017-04-02 DIAGNOSIS — F419 Anxiety disorder, unspecified: Secondary | ICD-10-CM | POA: Diagnosis not present

## 2017-04-02 DIAGNOSIS — N920 Excessive and frequent menstruation with regular cycle: Secondary | ICD-10-CM | POA: Insufficient documentation

## 2017-04-02 DIAGNOSIS — Z8249 Family history of ischemic heart disease and other diseases of the circulatory system: Secondary | ICD-10-CM | POA: Diagnosis not present

## 2017-04-02 DIAGNOSIS — N924 Excessive bleeding in the premenopausal period: Secondary | ICD-10-CM | POA: Diagnosis not present

## 2017-04-02 DIAGNOSIS — J45909 Unspecified asthma, uncomplicated: Secondary | ICD-10-CM | POA: Diagnosis not present

## 2017-04-02 DIAGNOSIS — N72 Inflammatory disease of cervix uteri: Secondary | ICD-10-CM | POA: Diagnosis not present

## 2017-04-02 HISTORY — PX: OOPHORECTOMY: SHX6387

## 2017-04-02 HISTORY — PX: LAPAROSCOPIC VAGINAL HYSTERECTOMY WITH SALPINGECTOMY: SHX6680

## 2017-04-02 HISTORY — PX: CYSTOSCOPY: SHX5120

## 2017-04-02 SURGERY — HYSTERECTOMY, VAGINAL, LAPAROSCOPY-ASSISTED, WITH SALPINGECTOMY
Anesthesia: General | Site: Bladder

## 2017-04-02 MED ORDER — ACETAMINOPHEN 500 MG PO TABS
1000.0000 mg | ORAL_TABLET | Freq: Once | ORAL | Status: AC
  Administered 2017-04-02: 1000 mg via ORAL

## 2017-04-02 MED ORDER — LACTATED RINGERS IV SOLN
INTRAVENOUS | Status: DC
  Administered 2017-04-02: 125 mL/h via INTRAVENOUS
  Administered 2017-04-02 (×2): via INTRAVENOUS

## 2017-04-02 MED ORDER — DEXAMETHASONE SODIUM PHOSPHATE 10 MG/ML IJ SOLN
INTRAMUSCULAR | Status: AC
  Filled 2017-04-02: qty 1

## 2017-04-02 MED ORDER — SCOPOLAMINE 1 MG/3DAYS TD PT72
1.0000 | MEDICATED_PATCH | Freq: Once | TRANSDERMAL | Status: DC
  Administered 2017-04-02: 1.5 mg via TRANSDERMAL

## 2017-04-02 MED ORDER — ROCURONIUM BROMIDE 100 MG/10ML IV SOLN
INTRAVENOUS | Status: DC | PRN
  Administered 2017-04-02: 40 mg via INTRAVENOUS

## 2017-04-02 MED ORDER — ONDANSETRON HCL 4 MG/2ML IJ SOLN
INTRAMUSCULAR | Status: DC | PRN
  Administered 2017-04-02: 4 mg via INTRAVENOUS

## 2017-04-02 MED ORDER — LIDOCAINE-EPINEPHRINE 1 %-1:100000 IJ SOLN
INTRAMUSCULAR | Status: AC
  Filled 2017-04-02: qty 1

## 2017-04-02 MED ORDER — HYDROMORPHONE HCL 1 MG/ML IJ SOLN
INTRAMUSCULAR | Status: AC
  Filled 2017-04-02: qty 1

## 2017-04-02 MED ORDER — ONDANSETRON HCL 4 MG PO TABS: 4.0000 mg | ORAL_TABLET | Freq: Four times a day (QID) | ORAL | Status: DC | PRN

## 2017-04-02 MED ORDER — SCOPOLAMINE 1 MG/3DAYS TD PT72
MEDICATED_PATCH | TRANSDERMAL | Status: AC
  Administered 2017-04-02: 1.5 mg via TRANSDERMAL
  Filled 2017-04-02: qty 1

## 2017-04-02 MED ORDER — KETOROLAC TROMETHAMINE 30 MG/ML IJ SOLN
30.0000 mg | Freq: Four times a day (QID) | INTRAMUSCULAR | Status: DC
  Administered 2017-04-02 – 2017-04-03 (×3): 30 mg via INTRAVENOUS
  Filled 2017-04-02 (×3): qty 1

## 2017-04-02 MED ORDER — CEFOTETAN DISODIUM-DEXTROSE 2-2.08 GM-%(50ML) IV SOLR
2.0000 g | INTRAVENOUS | Status: AC
  Administered 2017-04-02: 2 g via INTRAVENOUS

## 2017-04-02 MED ORDER — FENTANYL CITRATE (PF) 250 MCG/5ML IJ SOLN
INTRAMUSCULAR | Status: AC
  Filled 2017-04-02: qty 5

## 2017-04-02 MED ORDER — ROCURONIUM BROMIDE 100 MG/10ML IV SOLN
INTRAVENOUS | Status: AC
  Filled 2017-04-02: qty 1

## 2017-04-02 MED ORDER — OXYCODONE-ACETAMINOPHEN 5-325 MG PO TABS: 2.0000 | ORAL_TABLET | ORAL | Status: DC | PRN

## 2017-04-02 MED ORDER — ACETAMINOPHEN 325 MG PO TABS: 650.0000 mg | ORAL_TABLET | Freq: Four times a day (QID) | ORAL | Status: DC | PRN

## 2017-04-02 MED ORDER — OXYCODONE HCL 5 MG PO TABS: 5.0000 mg | ORAL_TABLET | Freq: Once | ORAL | Status: DC | PRN

## 2017-04-02 MED ORDER — MIDAZOLAM HCL 2 MG/2ML IJ SOLN
INTRAMUSCULAR | Status: AC
  Filled 2017-04-02: qty 2

## 2017-04-02 MED ORDER — LIDOCAINE-EPINEPHRINE 1 %-1:100000 IJ SOLN
INTRAMUSCULAR | Status: DC | PRN
Start: 2017-04-02 — End: 2017-04-02
  Administered 2017-04-02: 12 mL

## 2017-04-02 MED ORDER — HYDROMORPHONE HCL 1 MG/ML IJ SOLN
INTRAMUSCULAR | Status: DC | PRN
  Administered 2017-04-02: 1 mg via INTRAVENOUS

## 2017-04-02 MED ORDER — DEXTROSE-NACL 5-0.9 % IV SOLN
INTRAVENOUS | Status: DC
  Administered 2017-04-02: 17:00:00 via INTRAVENOUS

## 2017-04-02 MED ORDER — ONDANSETRON HCL 4 MG/2ML IJ SOLN
INTRAMUSCULAR | Status: AC
  Filled 2017-04-02: qty 2

## 2017-04-02 MED ORDER — CEFOTETAN DISODIUM-DEXTROSE 2-2.08 GM-%(50ML) IV SOLR
INTRAVENOUS | Status: AC
  Filled 2017-04-02: qty 50

## 2017-04-02 MED ORDER — DIPHENHYDRAMINE HCL 25 MG PO CAPS: 25.0000 mg | ORAL_CAPSULE | Freq: Four times a day (QID) | ORAL | Status: DC | PRN

## 2017-04-02 MED ORDER — STERILE WATER FOR IRRIGATION IR SOLN
Status: DC | PRN
  Administered 2017-04-02: 1000 mL

## 2017-04-02 MED ORDER — BUPIVACAINE HCL (PF) 0.25 % IJ SOLN
INTRAMUSCULAR | Status: DC | PRN
  Administered 2017-04-02: 3 mL
  Administered 2017-04-02: 5 mL

## 2017-04-02 MED ORDER — LIDOCAINE HCL (CARDIAC) 20 MG/ML IV SOLN
INTRAVENOUS | Status: AC
  Filled 2017-04-02: qty 5

## 2017-04-02 MED ORDER — DEXAMETHASONE SODIUM PHOSPHATE 4 MG/ML IJ SOLN
INTRAMUSCULAR | Status: DC | PRN
  Administered 2017-04-02 (×2): 5 mg via INTRAVENOUS

## 2017-04-02 MED ORDER — LIDOCAINE HCL (CARDIAC) 20 MG/ML IV SOLN
INTRAVENOUS | Status: DC | PRN
  Administered 2017-04-02: 100 mg via INTRAVENOUS

## 2017-04-02 MED ORDER — SUGAMMADEX SODIUM 200 MG/2ML IV SOLN
INTRAVENOUS | Status: AC
  Filled 2017-04-02: qty 2

## 2017-04-02 MED ORDER — GLYCOPYRROLATE 0.2 MG/ML IJ SOLN
INTRAMUSCULAR | Status: DC | PRN
  Administered 2017-04-02: 0.1 mg via INTRAVENOUS

## 2017-04-02 MED ORDER — PROPOFOL 10 MG/ML IV BOLUS
INTRAVENOUS | Status: AC
  Filled 2017-04-02: qty 20

## 2017-04-02 MED ORDER — MORPHINE SULFATE (PF) 4 MG/ML IV SOLN: 1.0000 mg | INTRAVENOUS | Status: DC | PRN

## 2017-04-02 MED ORDER — BUPIVACAINE HCL (PF) 0.25 % IJ SOLN
INTRAMUSCULAR | Status: AC
  Filled 2017-04-02: qty 30

## 2017-04-02 MED ORDER — BUPROPION HCL ER (XL) 150 MG PO TB24
150.0000 mg | ORAL_TABLET | Freq: Every day | ORAL | Status: DC
  Filled 2017-04-02 (×2): qty 1

## 2017-04-02 MED ORDER — ONDANSETRON HCL 4 MG/2ML IJ SOLN: 4.0000 mg | Freq: Four times a day (QID) | INTRAMUSCULAR | Status: DC | PRN

## 2017-04-02 MED ORDER — KETOROLAC TROMETHAMINE 30 MG/ML IJ SOLN: 30.0000 mg | Freq: Four times a day (QID) | INTRAMUSCULAR | Status: DC

## 2017-04-02 MED ORDER — FAMOTIDINE 20 MG PO TABS
ORAL_TABLET | ORAL | Status: AC
  Filled 2017-04-02: qty 1

## 2017-04-02 MED ORDER — SODIUM CHLORIDE 0.9 % IR SOLN
Status: DC | PRN
  Administered 2017-04-02: 3000 mL

## 2017-04-02 MED ORDER — FENTANYL CITRATE (PF) 100 MCG/2ML IJ SOLN
INTRAMUSCULAR | Status: DC | PRN
  Administered 2017-04-02 (×5): 50 ug via INTRAVENOUS

## 2017-04-02 MED ORDER — PROMETHAZINE HCL 25 MG/ML IJ SOLN: 6.2500 mg | INTRAMUSCULAR | Status: DC | PRN

## 2017-04-02 MED ORDER — FAMOTIDINE 20 MG PO TABS
20.0000 mg | ORAL_TABLET | Freq: Once | ORAL | Status: AC
  Administered 2017-04-02: 20 mg via ORAL

## 2017-04-02 MED ORDER — SUGAMMADEX SODIUM 200 MG/2ML IV SOLN
INTRAVENOUS | Status: DC | PRN
  Administered 2017-04-02: 140 mg via INTRAVENOUS

## 2017-04-02 MED ORDER — HYDROMORPHONE HCL 1 MG/ML IJ SOLN: 0.2500 mg | INTRAMUSCULAR | Status: DC | PRN

## 2017-04-02 MED ORDER — OXYCODONE HCL 5 MG/5ML PO SOLN: 5.0000 mg | Freq: Once | ORAL | Status: DC | PRN

## 2017-04-02 MED ORDER — METOCLOPRAMIDE HCL 5 MG/ML IJ SOLN
INTRAMUSCULAR | Status: DC | PRN
  Administered 2017-04-02: 10 mg via INTRAVENOUS

## 2017-04-02 MED ORDER — PROPOFOL 10 MG/ML IV BOLUS
INTRAVENOUS | Status: DC | PRN
  Administered 2017-04-02: 200 mg via INTRAVENOUS

## 2017-04-02 MED ORDER — MIDAZOLAM HCL 2 MG/2ML IJ SOLN
INTRAMUSCULAR | Status: DC | PRN
  Administered 2017-04-02: 2 mg via INTRAVENOUS

## 2017-04-02 MED ORDER — ACETAMINOPHEN 500 MG PO TABS
ORAL_TABLET | ORAL | Status: AC
  Filled 2017-04-02: qty 2

## 2017-04-02 SURGICAL SUPPLY — 52 items
ADH SKN CLS APL DERMABOND .7 (GAUZE/BANDAGES/DRESSINGS) ×2
APL SRG 38 LTWT LNG FL B (MISCELLANEOUS)
APPLICATOR ARISTA FLEXITIP XL (MISCELLANEOUS) IMPLANT
CABLE HIGH FREQUENCY MONO STRZ (ELECTRODE) IMPLANT
CONT PATH 16OZ SNAP LID 3702 (MISCELLANEOUS) ×3 IMPLANT
COVER BACK TABLE 60X90IN (DRAPES) ×3 IMPLANT
COVER MAYO STAND STRL (DRAPES) ×3 IMPLANT
DECANTER SPIKE VIAL GLASS SM (MISCELLANEOUS) ×6 IMPLANT
DERMABOND ADVANCED (GAUZE/BANDAGES/DRESSINGS) ×1
DERMABOND ADVANCED .7 DNX12 (GAUZE/BANDAGES/DRESSINGS) ×2 IMPLANT
DRSG OPSITE POSTOP 3X4 (GAUZE/BANDAGES/DRESSINGS) ×3 IMPLANT
DURAPREP 26ML APPLICATOR (WOUND CARE) ×3 IMPLANT
ELECT REM PT RETURN 9FT ADLT (ELECTROSURGICAL) ×3
ELECTRODE REM PT RTRN 9FT ADLT (ELECTROSURGICAL) ×2 IMPLANT
FILTER SMOKE EVAC LAPAROSHD (FILTER) ×3 IMPLANT
GLOVE BIO SURGEON STRL SZ 6.5 (GLOVE) ×6 IMPLANT
GLOVE BIO SURGEON STRL SZ7.5 (GLOVE) ×9 IMPLANT
GLOVE BIOGEL PI IND STRL 6.5 (GLOVE) ×2 IMPLANT
GLOVE BIOGEL PI IND STRL 7.0 (GLOVE) ×6 IMPLANT
GLOVE BIOGEL PI INDICATOR 6.5 (GLOVE) ×1
GLOVE BIOGEL PI INDICATOR 7.0 (GLOVE) ×5
HEMOSTAT ARISTA ABSORB 3G PWDR (MISCELLANEOUS) IMPLANT
LEGGING LITHOTOMY PAIR STRL (DRAPES) ×3 IMPLANT
NDL MAYO CATGUT SZ4 TPR NDL (NEEDLE) IMPLANT
NEEDLE MAYO CATGUT SZ4 (NEEDLE) IMPLANT
NS IRRIG 1000ML POUR BTL (IV SOLUTION) ×3 IMPLANT
PACK LAVH (CUSTOM PROCEDURE TRAY) ×3 IMPLANT
PACK ROBOTIC GOWN (GOWN DISPOSABLE) ×3 IMPLANT
PACK TRENDGUARD 450 HYBRID PRO (MISCELLANEOUS) IMPLANT
PACK TRENDGUARD 600 HYBRD PROC (MISCELLANEOUS) IMPLANT
PAD OB MATERNITY 4.3X12.25 (PERSONAL CARE ITEMS) ×3 IMPLANT
POUCH LAPAROSCOPIC INSTRUMENT (MISCELLANEOUS) ×3 IMPLANT
PROTECTOR NERVE ULNAR (MISCELLANEOUS) ×6 IMPLANT
SCISSORS LAP 5X35 DISP (ENDOMECHANICALS) IMPLANT
SET CYSTO W/LG BORE CLAMP LF (SET/KITS/TRAYS/PACK) ×3 IMPLANT
SET IRRIG TUBING LAPAROSCOPIC (IRRIGATION / IRRIGATOR) ×3 IMPLANT
SHEARS HARMONIC ACE PLUS 36CM (ENDOMECHANICALS) ×3 IMPLANT
SLEEVE XCEL OPT CAN 5 100 (ENDOMECHANICALS) ×3 IMPLANT
SUT PLAIN 4 0 FS 2 27 (SUTURE) ×3 IMPLANT
SUT VIC AB 0 CT1 18XCR BRD8 (SUTURE) ×4 IMPLANT
SUT VIC AB 0 CT1 36 (SUTURE) ×3 IMPLANT
SUT VIC AB 0 CT1 8-18 (SUTURE) ×6
SUT VICRYL 0 TIES 12 18 (SUTURE) ×3 IMPLANT
SUT VICRYL 0 UR6 27IN ABS (SUTURE) ×3 IMPLANT
SYR BULB IRRIGATION 50ML (SYRINGE) ×3 IMPLANT
TOWEL OR 17X24 6PK STRL BLUE (TOWEL DISPOSABLE) ×6 IMPLANT
TRAY FOLEY CATH SILVER 14FR (SET/KITS/TRAYS/PACK) ×3 IMPLANT
TRENDGUARD 450 HYBRID PRO PACK (MISCELLANEOUS) ×3
TRENDGUARD 600 HYBRID PROC PK (MISCELLANEOUS)
TROCAR XCEL NON-BLD 11X100MML (ENDOMECHANICALS) ×3 IMPLANT
TROCAR XCEL NON-BLD 5MMX100MML (ENDOMECHANICALS) ×3 IMPLANT
WARMER LAPAROSCOPE (MISCELLANEOUS) ×6 IMPLANT

## 2017-04-02 NOTE — Anesthesia Procedure Notes (Signed)
Procedure Name: Intubation Date/Time: 04/02/2017 7:43 AM Performed by: Flossie Dibble, CRNA Pre-anesthesia Checklist: Patient identified, Patient being monitored, Timeout performed, Emergency Drugs available and Suction available Patient Re-evaluated:Patient Re-evaluated prior to induction Oxygen Delivery Method: Circle System Utilized Preoxygenation: Pre-oxygenation with 100% oxygen Induction Type: IV induction Ventilation: Mask ventilation without difficulty Laryngoscope Size: Mac and 3 Grade View: Grade I Tube type: Oral Tube size: 7.0 mm Number of attempts: 1 Airway Equipment and Method: stylet Placement Confirmation: ETT inserted through vocal cords under direct vision,  positive ETCO2 and breath sounds checked- equal and bilateral Secured at: 21.5 cm Tube secured with: Tape Dental Injury: Teeth and Oropharynx as per pre-operative assessment  Difficulty Due To: Difficulty was unanticipated and Difficult Airway- due to reduced neck mobility

## 2017-04-02 NOTE — Anesthesia Preprocedure Evaluation (Signed)
Anesthesia Evaluation  Patient identified by MRN, date of birth, ID band Patient awake    Reviewed: Allergy & Precautions, NPO status , Patient's Chart, lab work & pertinent test results  Airway Mallampati: II  TM Distance: >3 FB Neck ROM: Full    Dental no notable dental hx.    Pulmonary asthma ,    Pulmonary exam normal breath sounds clear to auscultation       Cardiovascular negative cardio ROS Normal cardiovascular exam Rhythm:Regular Rate:Normal     Neuro/Psych Anxiety negative neurological ROS     GI/Hepatic negative GI ROS, Neg liver ROS,   Endo/Other  negative endocrine ROS  Renal/GU negative Renal ROS     Musculoskeletal negative musculoskeletal ROS (+)   Abdominal   Peds  Hematology negative hematology ROS (+)   Anesthesia Other Findings Menorrhagia Leiomyoma     Reproductive/Obstetrics                             Anesthesia Physical Anesthesia Plan  ASA: II  Anesthesia Plan: General   Post-op Pain Management:    Induction: Intravenous  PONV Risk Score and Plan: 4 or greater and Scopolamine patch - Pre-op, Midazolam, Dexamethasone, Ondansetron and Treatment may vary due to age or medical condition  Airway Management Planned: Oral ETT  Additional Equipment:   Intra-op Plan:   Post-operative Plan: Extubation in OR  Informed Consent: I have reviewed the patients History and Physical, chart, labs and discussed the procedure including the risks, benefits and alternatives for the proposed anesthesia with the patient or authorized representative who has indicated his/her understanding and acceptance.   Dental advisory given  Plan Discussed with: CRNA  Anesthesia Plan Comments:         Anesthesia Quick Evaluation

## 2017-04-02 NOTE — H&P (Signed)
The patient was examined.  I reviewed the proposed surgery and consent form with the patient.  The dictated history and physical is current and accurate and all questions were answered. The patient is ready to proceed with surgery and has a realistic understanding and expectation for the outcome.   Anastasio Auerbach MD, 7:19 AM 04/02/2017

## 2017-04-02 NOTE — Transfer of Care (Signed)
Immediate Anesthesia Transfer of Care Note  Patient: Kelly Lawrence  Procedure(s) Performed: LAPAROSCOPIC ASSISTED VAGINAL HYSTERECTOMY WITH SALPINGECTOMY (Bilateral Abdomen)  Patient Location: PACU  Anesthesia Type:General  Level of Consciousness: awake, alert  and oriented  Airway & Oxygen Therapy: Patient Spontanous Breathing and Patient connected to nasal cannula oxygen  Post-op Assessment: Report given to RN and Post -op Vital signs reviewed and stable  Post vital signs: Reviewed and stable  Last Vitals:  Vitals:   04/02/17 0612  BP: 118/81  Pulse: 77  Resp: 16  Temp: 36.7 C  SpO2: 98%    Last Pain:  Vitals:   04/02/17 0612  TempSrc: Oral      Patients Stated Pain Goal: 4 (32/20/25 4270)  Complications: No apparent anesthesia complications

## 2017-04-02 NOTE — Progress Notes (Signed)
Patient ID: Kelly Lawrence, female   DOB: 1966/02/19, 52 y.o.   MRN: [id number] In to see patient  Doing well. Ambulated X 2, foley out  Abdomen soft minimal/nontender.  Incisions dry  Reviewed post op instructions with the patient.  Plan discharge tomorrow AM.  Has supply of pain meds at home.

## 2017-04-02 NOTE — Anesthesia Postprocedure Evaluation (Signed)
Anesthesia Post Note  Patient: Kelly Lawrence  Procedure(s) Performed: LAPAROSCOPIC ASSISTED VAGINAL HYSTERECTOMY WITH SALPINGECTOMY (Bilateral Abdomen) OOPHORECTOMY (Bilateral Abdomen) CYSTOSCOPY (N/A Bladder)     Patient location during evaluation: PACU Anesthesia Type: General Level of consciousness: awake and alert Pain management: pain level controlled Vital Signs Assessment: post-procedure vital signs reviewed and stable Respiratory status: spontaneous breathing, nonlabored ventilation, respiratory function stable and patient connected to nasal cannula oxygen Cardiovascular status: blood pressure returned to baseline and stable Postop Assessment: no apparent nausea or vomiting Anesthetic complications: no    Last Vitals:  Vitals:   04/02/17 1050 04/02/17 1150  BP: (!) 107/56 (!) 96/52  Pulse: 60 65  Resp: 18 18  Temp: 36.9 C 36.6 C  SpO2: 98% 97%    Last Pain:  Vitals:   04/02/17 1150  TempSrc: Oral  PainSc:                  Ryan P Ellender

## 2017-04-02 NOTE — Op Note (Signed)
Kelly Lawrence [birth year] [id number]   Post Operative Note   Date of surgery:  04/02/2017  Pre Op Dx: Menorrhagia  Post Op Dx: Menorrhagia  Procedure: Laparoscopic-assisted vaginal hysterectomy, bilateral salpingo-oophorectomy, cystoscopy  Surgeon:  Anastasio Auerbach  Assistant: Thomes Cake  Anesthesia:  General  EBL: 100 cc anesthesia reported  Complications:  None  Specimen: Uterus, bilateral fallopian tubes, bilateral ovaries to pathology  Findings: EUA: External BUS vagina normal.  Cervix normal.  Uterus normal size midline mobile.  Adnexa without masses   Operative: Anterior cul-de-sac normal.  Posterior cul-de-sac normal.  Uterus grossly normal size, shape and contour.  Right and left fallopian tubes normal length, caliber and fimbriated ends.  Right and left ovaries grossly normal.  No evidence of pelvic adhesions or endometriosis.  Upper abdominal exam shows liver smooth.  Gallbladder is normal.  Appendix grossly normal free and mobile.  No upper abdominal pathology noted or adhesions.   Cystoscopy post operatively: Bladder mucosa normal in appearance without evidence of trauma.  Right and left ureteral jets noted.  Procedure: The patient was taken to the operating room, placed in the low dorsolithotomy position, underwent general anesthesia, received the standard abdominal and vaginal preparation per nursing personnel and a Foley catheter was placed in sterile technique.  The timeout was performed by the surgical team.  An EUA was performed and a Hulka tenaculum was placed through the cervix.  The patient was draped in the usual fashion.  A transverse infraumbilical incision was made and using the 10 mm direct entry trocar the abdomen was directly entered under direct visualization without difficulty.  The abdomen was then insufflated and right and left 5 mm suprapubic ports were then placed without difficulty after transillumination for the vessels.  Examination of the  pelvic organs and upper abdominal exam was carried out with findings noted above.  The right infundibulopelvic ligament and vessels were exposed, the ureter identified away from the site and using the harmonic scalpel the pedicle was transected without difficulty.  The broad ligament was transected to the level of the round ligament which subsequently was transected using the harmonic scalpel.  The parametrial tissues were transected using the harmonic scalpel and the anterior vesico-uterine peritoneal fold was transected to the midline using the harmonic scalpel.  A similar procedure was carried out on the other side.  At this point attention was then directed to the vaginal portion of the procedure and the patient was placed in the high dorsal lithotomy position, the cervix exposed with a weighted speculum and a tenaculum placed on the anterior lip of the cervix.  The cervical mucosa was then circumferentially and injected using 1% lidocaine with 1:100,000 epinephrine solution, 10 cc total.  The cervical mucosa was then sharply incised circumferentially and the paracervical planes were sharply developed.  The anterior cul-de-sac was then entered without difficulty as was the posterior cul-de-sac and a long weighted speculum was then placed.  The right and left uterosacral ligaments were identified clamped cut and ligated using 0 Vicryl suture and tagged for future reference.  The uterus was then freed from its attachments through clamping, cutting and ligating of the paracervical and parametrial tissues noting ligation of the uterine arteries bilaterally.  Due to the narrow pelvis to complete the hysterectomy the cervix and lower uterus were excised to allow better exposure to the remaining attachments which were subsequently clamped cut and ligated using 0 Vicryl suture and the total specimen removed.  The weighted speculum was replaced  with a shorter speculum, the intestines packed from the site using a tagged  tail sponge and the posterior vaginal cuff was run from uterosacral ligament to uterosacral ligament using 0 Vicryl suture in a running interlocking stitch.  The sponge was removed and the cul-de-sac irrigated showing adequate hemostasis and the vagina was closed anterior to posterior using 0 Vicryl suture in figure-of-eight stitch.  The vagina was irrigated showing hemostasis and attention was then turned to the cystoscopy.  The Foley catheter was removed and the cystoscopy was performed which showed normal bladder mucosa without evidence of trauma and good bilateral ureteral jets noting the patient had received Pyridium preoperatively.  The Foley catheter was replaced, the operating team regloved and regowned and the patient was placed in the low dorsal lithotomy position and the abdomen was reinsufflated.  The pelvis was copiously irrigated showing adequate hemostasis at all pedicles and the gas was slowly allowed to escape showing continued hemostasis under low pressure situation.  The right and left suprapubic ports were then removed under direct visualization and the infraumbilical port was backed out under direct visualization showing adequate hemostasis no evidence of hernia formation.  A 0 Vicryl interrupted subcutaneous fascial stitch was placed infraumbilically and the skin incision was closed using 4-0 plain suture in a running subcuticular stitch.  All skin incisions were injected using 0.25% Marcaine and Dermabond skin adhesive was applied to close the 5 mm ports and over the closed infraumbilical incision.  Sterile dressings were applied.  The sponge, needle and instrument count were verified correct.  The specimen was identified for pathology.  The patient received intraoperative Toradol.  She was awakened without difficulty and taken to recovery room in good condition having tolerated procedure well.    Anastasio Auerbach MD, 9:40 AM 04/02/2017

## 2017-04-03 ENCOUNTER — Encounter (HOSPITAL_COMMUNITY): Payer: Self-pay | Admitting: Gynecology

## 2017-04-03 DIAGNOSIS — Z79899 Other long term (current) drug therapy: Secondary | ICD-10-CM | POA: Diagnosis not present

## 2017-04-03 DIAGNOSIS — Z882 Allergy status to sulfonamides status: Secondary | ICD-10-CM | POA: Diagnosis not present

## 2017-04-03 DIAGNOSIS — N920 Excessive and frequent menstruation with regular cycle: Secondary | ICD-10-CM | POA: Diagnosis not present

## 2017-04-03 DIAGNOSIS — Z8249 Family history of ischemic heart disease and other diseases of the circulatory system: Secondary | ICD-10-CM | POA: Diagnosis not present

## 2017-04-03 DIAGNOSIS — J45909 Unspecified asthma, uncomplicated: Secondary | ICD-10-CM | POA: Diagnosis not present

## 2017-04-03 DIAGNOSIS — Z833 Family history of diabetes mellitus: Secondary | ICD-10-CM | POA: Diagnosis not present

## 2017-04-03 DIAGNOSIS — F419 Anxiety disorder, unspecified: Secondary | ICD-10-CM | POA: Diagnosis not present

## 2017-04-03 MED ORDER — ESTRADIOL 0.5 MG PO TABS: 0.5000 mg | ORAL_TABLET | Freq: Every day | ORAL | 11 refills | Status: DC

## 2017-04-03 NOTE — Progress Notes (Signed)
Discharge instructions given, questions answered, pt states understanding, signs and given copy

## 2017-04-03 NOTE — Progress Notes (Signed)
Patient ID: Kelly Lawrence, female   DOB: 1965-04-12, 52 y.o.   MRN: [id number] Kelly Lawrence [birth year] [id number]   1 Day Post-Op Procedure(s) (LRB): LAPAROSCOPIC ASSISTED VAGINAL HYSTERECTOMY WITH SALPINGECTOMY (Bilateral) OOPHORECTOMY (Bilateral) CYSTOSCOPY (N/A)  Subjective: Patient reports feels well, no complaints, pain severity reported mild, Yes.   taking PO, foley catheter out, Yes.   voiding, Yes.   ambulating, Yes.   passing flatus  Objective: Vital signs in last 24 hours: Temp:  [97.9 F (36.6 C)-98.7 F (37.1 C)] 98.4 F (36.9 C) (02/06 0422) Pulse Rate:  [57-90] 79 (02/06 0422) Resp:  [9-19] 18 (02/06 0422) BP: (96-119)/(52-74) 112/58 (02/06 0422) SpO2:  [95 %-100 %] 99 % (02/06 0422) Weight:  [160 lb 12.8 oz (72.9 kg)] 160 lb 12.8 oz (72.9 kg) (02/05 1150) Last BM Date: 04/01/17    EXAM General: awake, alert and no distress Resp: clear to auscultation bilaterally Cardio: regular rate and rhythm GI: soft, non tender, bowel sounds active, incisions dry intact Lower Extremities: Without swelling or tenderness Vaginal Bleeding: Reported scant   Assessment: s/p Procedure(s): LAPAROSCOPIC ASSISTED VAGINAL HYSTERECTOMY WITH SALPINGECTOMY OOPHORECTOMY CYSTOSCOPY: progressing well, ready for discharge.    Plan: Discharge home today.  Precautions, instructions and follow up were discussed with the patient.  Prescription provided per AVS.  Patient to call the office to arrange a post-operative appointmant in 2 weeks.    Anastasio Auerbach MD, 7:32 AM 04/03/2017

## 2017-04-03 NOTE — Discharge Instructions (Signed)
°  Postoperative Instructions Hysterectomy ° °Dr. Daschel Roughton and the nursing staff have discussed postoperative instructions with you.  If you have any questions please ask them before you leave the hospital, or call Dr Coumba Kellison’s office at [phone].   ° °We would like to emphasize the following instructions: ° ° °  Call the office to make your follow-up appointment as recommended by Dr Niasha Devins (usually 2 weeks). ° °  You were given a prescription, or one was ordered for you at the pharmacy you designated.  Get that prescription filled and take the medication according to instructions. ° °  You may eat a regular diet, but slowly until you start having bowel movements. ° °  Drink plenty of water daily. ° °  Nothing in the vagina (intercourse, douching, objects of any kind) until released by Dr Krew Hortman. ° °  No driving for two weeks.  Wait to be cleared by Dr Jamarea Selner at your first post op check.  Car rides (short) are ok after several days at home, as long as you are not having significant pain, but no traveling out of town. ° °  You may shower, but no baths.  Walking up and down stairs is ok.  No heavy lifting, prolonged standing, repeated bending or any “working out” until your first post op check. ° °  Rest frequently, listen to your body and do not push yourself and overdo it. ° °  Call if: ° °o Your pain medication does not seem strong enough. °o Worsening pain or abdominal bloating °o Persistent nausea or vomiting °o Difficulty with urination or bowel movements. °o Temperature of 101 degrees or higher. °o Bleeding heavier then staining (clots or period type flow). °o Incisions become red, tender or begin to drain. °o You have any questions or concerns. °

## 2017-04-05 ENCOUNTER — Telehealth: Payer: Self-pay | Admitting: *Deleted

## 2017-04-05 NOTE — Telephone Encounter (Signed)
19Pt was TCM list admitted 04/02/17 for surgery hx of excessive bleeding while on menstrual. Pt had a Laparoscopic vaginal hysterectomy. Pt D/C 04/04/17, and will follow-up w/Dr. Phineas Real on 04/18/17.Marland KitchenJohny Chess

## 2017-04-18 ENCOUNTER — Ambulatory Visit (INDEPENDENT_AMBULATORY_CARE_PROVIDER_SITE_OTHER): Payer: BLUE CROSS/BLUE SHIELD | Admitting: Gynecology

## 2017-04-18 ENCOUNTER — Encounter: Payer: Self-pay | Admitting: Gynecology

## 2017-04-18 VITALS — BP 118/76

## 2017-04-18 DIAGNOSIS — Z9889 Other specified postprocedural states: Secondary | ICD-10-CM

## 2017-04-18 NOTE — Patient Instructions (Signed)
Follow-up in 2 weeks for your next postoperative visit. 

## 2017-04-18 NOTE — Progress Notes (Signed)
    Kelly Lawrence 16-Jul-1965 [id number]        52 y.o.  G2P2 presents for her first postoperative visit status post LAVH BSO.  On estradiol 0.5 mg doing well with no hot flushes or sweats.  Active with no pain.  Eating drinking voiding and having bowel movements without difficulty  Past medical history,surgical history, problem list, medications, allergies, family history and social history were all reviewed and documented in the EPIC chart.  Directed ROS with pertinent positives and negatives documented in the history of present illness/assessment and plan.  Exam: [name] assistant Vitals:   04/18/17 1222  BP: 118/76   General appearance:  Normal Abdomen soft nontender without masses guarding rebound.  Incision is healed nicely. Pelvic external BUS vagina with cuff intact.  Bimanual without masses or tenderness  Assessment/Plan:  52 y.o. G2P2 with normal postoperative visit status post LAVH BSO.  Doing well on estradiol 0.5 mg.  Slowly resume normal activities with the exception of pelvic rest.  Follow-up in 2 weeks for next postoperative visit.    Anastasio Auerbach MD, 12:50 PM 04/18/2017

## 2017-05-01 ENCOUNTER — Encounter: Payer: Self-pay | Admitting: Gynecology

## 2017-05-01 ENCOUNTER — Ambulatory Visit (INDEPENDENT_AMBULATORY_CARE_PROVIDER_SITE_OTHER): Payer: BLUE CROSS/BLUE SHIELD | Admitting: Gynecology

## 2017-05-01 VITALS — BP 120/76

## 2017-05-01 DIAGNOSIS — Z9889 Other specified postprocedural states: Secondary | ICD-10-CM

## 2017-05-01 NOTE — Patient Instructions (Signed)
Follow-up end of summer for annual exam, sooner as needed.

## 2017-05-01 NOTE — Progress Notes (Signed)
    Kelly Lawrence Jun 09, 1965 [id number]        52 y.o.  G2P2 presents for her postoperative visit status post LAVH BSO on estradiol 0.5 mg doing well.  No menopausal symptoms.  Past medical history,surgical history, problem list, medications, allergies, family history and social history were all reviewed and documented in the EPIC chart.  Directed ROS with pertinent positives and negatives documented in the history of present illness/assessment and plan.  Exam: [name] assistant Vitals:   05/01/17 1118  BP: 120/76   General appearance:  Normal Abdomen soft nontender without masses guarding rebound.  Incisions well-healed. Pelvic external BUS vagina with cuff healing nicely.  Suture line intact.  Bimanual without masses or tenderness.  Assessment/Plan:  52 y.o. G2P2 with normal postoperative visit status post LAVH BSO.  Doing well on her estradiol.  Will slowly resume normal activities with the exception of pelvic rest for another 2-4 weeks.  She will follow-up if she has any issues otherwise will follow-up at the end of the summer when due for annual exam.    Anastasio Auerbach MD, 11:47 AM 05/01/2017

## 2017-05-23 ENCOUNTER — Telehealth: Payer: Self-pay | Admitting: Internal Medicine

## 2017-05-23 MED ORDER — BUPROPION HCL ER (XL) 150 MG PO TB24: 150.0000 mg | ORAL_TABLET | Freq: Every day | ORAL | 0 refills | Status: DC

## 2017-05-23 NOTE — Telephone Encounter (Signed)
Per office policy sent 30 day to local pharmacy until appt.../lmb  

## 2017-05-23 NOTE — Telephone Encounter (Signed)
Copied from [place] [phone]. Topic: Quick Communication - Rx Refill/Question >> May 23, 2017  8:15 AM [name] wrote: Medication: buPROPion (WELLBUTRIN XL) 150 MG 24 hr tablet  Pt has appt set up for 06/13/17 hoping to get a refill until that appt. Pt has been out for 2 days.   Has the patient contacted their pharmacy? Yes.   (Agent: If no, request that the patient contact the pharmacy for the refill.) Preferred Pharmacy (with phone number or street name): [id number] - [place], [address] AT [address]: Please be advised that RX refills may take up to 3 business days. We ask that you follow-up with your pharmacy.

## 2017-06-13 ENCOUNTER — Ambulatory Visit (INDEPENDENT_AMBULATORY_CARE_PROVIDER_SITE_OTHER): Payer: BLUE CROSS/BLUE SHIELD | Admitting: Internal Medicine

## 2017-06-13 ENCOUNTER — Encounter: Payer: Self-pay | Admitting: Internal Medicine

## 2017-06-13 DIAGNOSIS — R002 Palpitations: Secondary | ICD-10-CM

## 2017-06-13 DIAGNOSIS — N921 Excessive and frequent menstruation with irregular cycle: Secondary | ICD-10-CM

## 2017-06-13 DIAGNOSIS — F411 Generalized anxiety disorder: Secondary | ICD-10-CM | POA: Diagnosis not present

## 2017-06-13 DIAGNOSIS — G4709 Other insomnia: Secondary | ICD-10-CM | POA: Diagnosis not present

## 2017-06-13 MED ORDER — SUVOREXANT 15 MG PO TABS: 15.0000 mg | ORAL_TABLET | Freq: Every evening | ORAL | 5 refills | Status: DC | PRN

## 2017-06-13 MED ORDER — BUPROPION HCL ER (XL) 150 MG PO TB24: 150.0000 mg | ORAL_TABLET | Freq: Every day | ORAL | 5 refills | Status: DC

## 2017-06-13 MED ORDER — VITAMIN D3 50 MCG (2000 UT) PO CAPS
2000.0000 [IU] | ORAL_CAPSULE | Freq: Every day | ORAL | 3 refills | Status: DC
Start: 2017-06-13 — End: 2019-01-07

## 2017-06-13 NOTE — Assessment & Plan Note (Signed)
Wellbutrin

## 2017-06-13 NOTE — Assessment & Plan Note (Signed)
Hysterectomy - complete 2019

## 2017-06-13 NOTE — Patient Instructions (Addendum)
Valerian root  May need to use Rozerem

## 2017-06-13 NOTE — Progress Notes (Signed)
Subjective:  Patient ID: Kelly Lawrence, female    DOB: 19-Aug-1965  Age: 52 y.o. MRN: [id number]  CC: No chief complaint on file.   HPI Kelly Lawrence presents for depression, insomnia f/u. No palpitations Stopped Zolpidem  Outpatient Medications Prior to Visit  Medication Sig Dispense Refill  . buPROPion (WELLBUTRIN XL) 150 MG 24 hr tablet Take 1 tablet (150 mg total) by mouth daily. Must keep scheduled appt for future refills 30 tablet 0  . estradiol (ESTRACE) 0.5 MG tablet Take 1 tablet (0.5 mg total) by mouth daily. 30 tablet 11  . Multiple Vitamin (MULTIVITAMIN) tablet Take 1 tablet by mouth daily.      No facility-administered medications prior to visit.     ROS Review of Systems  Constitutional: Negative for activity change, appetite change, chills, fatigue and unexpected weight change.  HENT: Negative for congestion, mouth sores and sinus pressure.   Eyes: Negative for visual disturbance.  Respiratory: Negative for cough and chest tightness.   Cardiovascular: Negative for chest pain and palpitations.  Gastrointestinal: Negative for abdominal pain and nausea.  Genitourinary: Negative for difficulty urinating, frequency and vaginal pain.  Musculoskeletal: Negative for back pain and gait problem.  Skin: Negative for pallor and rash.  Neurological: Negative for dizziness, tremors, weakness, numbness and headaches.  Psychiatric/Behavioral: Positive for sleep disturbance. Negative for confusion and suicidal ideas. The patient is nervous/anxious.     Objective:  BP 122/74 (BP Location: Left Arm, Patient Position: Sitting, Cuff Size: Normal)   Pulse 68   Temp 98 F (36.7 C) (Oral)   Ht 5\' 3"  (1.6 m)   Wt 164 lb (74.4 kg)   LMP 08/23/2016   SpO2 98%   BMI 29.05 kg/m   BP Readings from Last 3 Encounters:  06/13/17 122/74  05/01/17 120/76  04/18/17 118/76    Wt Readings from Last 3 Encounters:  06/13/17 164 lb (74.4 kg)  04/02/17 160 lb 12.8 oz (72.9 kg)    03/27/17 160 lb 8 oz (72.8 kg)    Physical Exam  Constitutional: She appears well-developed. No distress.  HENT:  Head: Normocephalic.  Right Ear: External ear normal.  Left Ear: External ear normal.  Nose: Nose normal.  Mouth/Throat: Oropharynx is clear and moist.  Eyes: Pupils are equal, round, and reactive to light. Conjunctivae are normal. Right eye exhibits no discharge. Left eye exhibits no discharge.  Neck: Normal range of motion. Neck supple. No JVD present. No tracheal deviation present. No thyromegaly present.  Cardiovascular: Normal rate, regular rhythm and normal heart sounds.  Pulmonary/Chest: No stridor. No respiratory distress. She has no wheezes.  Abdominal: Soft. Bowel sounds are normal. She exhibits no distension and no mass. There is no tenderness. There is no rebound and no guarding.  Musculoskeletal: She exhibits no edema or tenderness.  Lymphadenopathy:    She has no cervical adenopathy.  Neurological: She displays normal reflexes. No cranial nerve deficit. She exhibits normal muscle tone. Coordination normal.  Skin: No rash noted. No erythema.  Psychiatric: She has a normal mood and affect. Her behavior is normal. Judgment and thought content normal.    Lab Results  Component Value Date   WBC 10.7 (H) 03/27/2017   HGB 13.5 03/27/2017   HCT 39.9 03/27/2017   PLT 376 03/27/2017   GLUCOSE 85 03/21/2015   CHOL 165 03/21/2015   TRIG 78 03/21/2015   HDL 57 03/21/2015   LDLCALC 92 03/21/2015   ALT 17 03/21/2015   AST 22 03/21/2015  NA 139 03/21/2015   K 4.3 03/21/2015   CL 105 03/21/2015   CREATININE 0.69 03/21/2015   BUN 10 03/21/2015   CO2 25 03/21/2015   TSH 0.94 01/05/2016    No results found.  Assessment & Plan:   There are no diagnoses linked to this encounter. I am having Kelly Lawrence maintain her multivitamin, estradiol, and buPROPion.  No orders of the defined types were placed in this encounter.    Follow-up: No follow-ups on  file.  Walker Kehr, MD

## 2017-06-13 NOTE — Assessment & Plan Note (Signed)
Try Belsomra

## 2017-06-13 NOTE — Assessment & Plan Note (Signed)
No relapse 

## 2017-06-17 ENCOUNTER — Other Ambulatory Visit (INDEPENDENT_AMBULATORY_CARE_PROVIDER_SITE_OTHER): Payer: Self-pay

## 2017-06-17 DIAGNOSIS — F411 Generalized anxiety disorder: Secondary | ICD-10-CM

## 2017-06-17 DIAGNOSIS — N921 Excessive and frequent menstruation with irregular cycle: Secondary | ICD-10-CM

## 2017-06-17 LAB — BASIC METABOLIC PANEL
BUN: 14 mg/dL (ref 6–23)
CALCIUM: 9.7 mg/dL (ref 8.4–10.5)
CO2: 29 mEq/L (ref 19–32)
CREATININE: 0.74 mg/dL (ref 0.40–1.20)
Chloride: 103 mEq/L (ref 96–112)
GFR: 88.35 mL/min (ref >60.00)
GLUCOSE: 78 mg/dL (ref 70–99)
POTASSIUM: 4.1 meq/L (ref 3.5–5.1)
Sodium: 141 mEq/L (ref 135–145)

## 2017-06-17 LAB — HEPATIC FUNCTION PANEL
ALK PHOS: 73 U/L (ref 39–117)
ALT: 14 U/L (ref 0–35)
AST: 17 U/L (ref 0–37)
Albumin: 4.2 g/dL (ref 3.5–5.2)
BILIRUBIN DIRECT: 0.1 mg/dL (ref 0.0–0.3)
BILIRUBIN TOTAL: 0.4 mg/dL (ref 0.2–1.2)
TOTAL PROTEIN: 6.6 g/dL (ref 6.0–8.3)

## 2017-06-17 LAB — CBC WITH DIFFERENTIAL/PLATELET
BASOS PCT: 1.2 % (ref 0.0–3.0)
Basophils Absolute: 0.1 10*3/uL (ref 0.0–0.1)
EOS PCT: 4.7 % (ref 0.0–5.0)
Eosinophils Absolute: 0.3 10*3/uL (ref 0.0–0.7)
HEMATOCRIT: 39.7 % (ref 36.0–46.0)
Hemoglobin: 13.5 g/dL (ref 12.0–15.0)
Lymphocytes Relative: 37 % (ref 12.0–46.0)
Lymphs Abs: 2.6 10*3/uL (ref 0.7–4.0)
MCHC: 34 g/dL (ref 30.0–36.0)
MCV: 89.5 fl (ref 78.0–100.0)
MONO ABS: 0.6 10*3/uL (ref 0.1–1.0)
MONOS PCT: 8.1 % (ref 3.0–12.0)
Neutro Abs: 3.5 10*3/uL (ref 1.4–7.7)
Neutrophils Relative %: 49 % (ref 43.0–77.0)
Platelets: 333 10*3/uL (ref 150.0–400.0)
RBC: 4.43 Mil/uL (ref 3.87–5.11)
RDW: 12.4 % (ref 11.5–15.5)
WBC: 7.1 10*3/uL (ref 4.0–10.5)

## 2017-06-17 LAB — URINALYSIS, ROUTINE W REFLEX MICROSCOPIC
BILIRUBIN URINE: NEGATIVE
KETONES UR: NEGATIVE
Nitrite: NEGATIVE
PH: 7 (ref 5.0–8.0)
RBC / HPF: NONE SEEN (ref 0–?)
Specific Gravity, Urine: 1.01 (ref 1.000–1.030)
TOTAL PROTEIN, URINE-UPE24: NEGATIVE
Urine Glucose: NEGATIVE
Urobilinogen, UA: 0.2 (ref 0.0–1.0)

## 2017-06-17 LAB — TSH: TSH: 1.47 u[IU]/mL (ref 0.35–4.50)

## 2017-06-17 LAB — LIPID PANEL
CHOLESTEROL: 191 mg/dL (ref 0–200)
HDL: 81.8 mg/dL (ref >39.00)
LDL Cholesterol: 97 mg/dL (ref 0–99)
NONHDL: 109.19
Total CHOL/HDL Ratio: 2
Triglycerides: 59 mg/dL (ref 0.0–149.0)
VLDL: 11.8 mg/dL (ref 0.0–40.0)

## 2017-07-10 DIAGNOSIS — R52 Pain, unspecified: Secondary | ICD-10-CM | POA: Diagnosis not present

## 2017-07-10 DIAGNOSIS — D2262 Melanocytic nevi of left upper limb, including shoulder: Secondary | ICD-10-CM | POA: Diagnosis not present

## 2017-07-10 DIAGNOSIS — D225 Melanocytic nevi of trunk: Secondary | ICD-10-CM | POA: Diagnosis not present

## 2017-09-18 ENCOUNTER — Encounter: Payer: Self-pay | Admitting: Gynecology

## 2017-09-18 ENCOUNTER — Ambulatory Visit (INDEPENDENT_AMBULATORY_CARE_PROVIDER_SITE_OTHER): Payer: BLUE CROSS/BLUE SHIELD | Admitting: Gynecology

## 2017-09-18 VITALS — BP 118/78 | Ht 63.0 in | Wt 167.0 lb

## 2017-09-18 DIAGNOSIS — Z01419 Encounter for gynecological examination (general) (routine) without abnormal findings: Secondary | ICD-10-CM

## 2017-09-18 DIAGNOSIS — Z79890 Hormone replacement therapy (postmenopausal): Secondary | ICD-10-CM | POA: Diagnosis not present

## 2017-09-18 MED ORDER — ESTRADIOL 0.5 MG PO TABS: 0.5000 mg | ORAL_TABLET | Freq: Every day | ORAL | 4 refills | Status: DC

## 2017-09-18 NOTE — Progress Notes (Signed)
    Kelly Lawrence [birth year] [id number]        52 y.o.  G2P2 for annual gynecologic exam.  Status post LAVH BSO earlier this year for menorrhagia.  Doing well on estradiol 0.5 mg daily.  Past medical history,surgical history, problem list, medications, allergies, family history and social history were all reviewed and documented as reviewed in the EPIC chart.  ROS:  Performed with pertinent positives and negatives included in the history, assessment and plan.   Additional significant findings : None   Exam: [name] assistant Vitals:   09/18/17 1517  BP: 118/78  Weight: 167 lb (75.8 kg)  Height: 5\' 3"  (1.6 m)   Body mass index is 29.58 kg/m.  General appearance:  Normal affect, orientation and appearance. Skin: Grossly normal HEENT: Without gross lesions.  No cervical or supraclavicular adenopathy. Thyroid normal.  Lungs:  Clear without wheezing, rales or rhonchi Cardiac: RR, without RMG Abdominal:  Soft, nontender, without masses, guarding, rebound, organomegaly or hernia Breasts:  Examined lying and sitting without masses, retractions, discharge or axillary adenopathy. Pelvic:  Ext, BUS, Vagina: Normal  Adnexa: Without masses or tenderness    Anus and perineum: Normal   Rectovaginal: Normal sphincter tone without palpated masses or tenderness.    Assessment/Plan:  52 y.o. G2P2 female for annual gynecologic exam.   1. Status post LAVH BSO for menorrhagia.  On estradiol 0.5 mg doing well without menopausal symptoms.  Risks versus benefits again reviewed.  Increased risk of thrombosis and breast cancer issue versus symptom relief as well as possible cardiovascular and bone health discussed.  We will continue for now.  Refill x1 year provided. 2. Mammography coming due in October.  Breast exam normal today. 3. Pap smear 2018.  No Pap smear done today.  No history of significant abnormal Pap smears.  Options to stop screening per current screening guidelines based on  hysterectomy history versus less frequent screening intervals.  Will readdress on an annual basis. 4. Health maintenance.  No routine lab work done as patient does this elsewhere.  Follow-up 1 year, sooner as needed.   Anastasio Auerbach MD, 3:46 PM 09/18/2017

## 2017-09-18 NOTE — Patient Instructions (Signed)
Follow-up in 1 year for annual exam, sooner as needed. 

## 2017-10-09 ENCOUNTER — Other Ambulatory Visit: Payer: Self-pay

## 2017-10-10 MED ORDER — BUPROPION HCL ER (XL) 150 MG PO TB24: 150.0000 mg | ORAL_TABLET | Freq: Every day | ORAL | 5 refills | Status: DC

## 2017-10-20 ENCOUNTER — Other Ambulatory Visit: Payer: Self-pay | Admitting: Internal Medicine

## 2017-10-20 MED ORDER — CLONAZEPAM 0.25 MG PO TBDP: 0.2500 mg | ORAL_TABLET | Freq: Two times a day (BID) | ORAL | 1 refills | Status: DC | PRN

## 2017-11-13 DIAGNOSIS — H5213 Myopia, bilateral: Secondary | ICD-10-CM | POA: Diagnosis not present

## 2017-11-21 ENCOUNTER — Other Ambulatory Visit: Payer: Self-pay | Admitting: Gynecology

## 2017-11-21 DIAGNOSIS — Z1231 Encounter for screening mammogram for malignant neoplasm of breast: Secondary | ICD-10-CM

## 2017-12-23 ENCOUNTER — Ambulatory Visit: Payer: Self-pay

## 2018-02-03 ENCOUNTER — Ambulatory Visit
Admission: RE | Admit: 2018-02-03 | Discharge: 2018-02-03 | Disposition: A | Discharge status: Home / Self Care | Payer: BLUE CROSS/BLUE SHIELD | Source: Ambulatory Visit | Attending: Gynecology | Admitting: Gynecology

## 2018-02-03 DIAGNOSIS — Z1231 Encounter for screening mammogram for malignant neoplasm of breast: Secondary | ICD-10-CM | POA: Diagnosis not present

## 2018-02-19 ENCOUNTER — Encounter: Payer: Self-pay | Admitting: Gynecology

## 2018-02-20 NOTE — Telephone Encounter (Signed)
The argument to continue estrogen is the potential benefit from a cardiovascular standpoint and bone health when the estrogen is started early in the perimenopause.  The risks include a slightly higher risk of blood clots.  If the patient is interested in weaning off then I would suggest taking a half a tablet daily and see how she does with that for the next month.  If she does all right then she can take a half a tablet every other day for several weeks and then stop it.  The key is to wean off slowly.

## 2018-06-02 ENCOUNTER — Other Ambulatory Visit: Payer: Self-pay | Admitting: Internal Medicine

## 2018-06-03 NOTE — Telephone Encounter (Signed)
sch VOV

## 2018-06-11 ENCOUNTER — Encounter: Payer: Self-pay | Admitting: Gynecology

## 2018-06-11 NOTE — Telephone Encounter (Signed)
Being on hormone replacement is a quality of life issue.  If she feels her symptoms are not significant then I would stay off of HRT.  If she feels that they are then I would restart the estrogen.  Remember the risks of low-dose estrogen are low.  Slight increased risk of thrombosis such as DVT.  In the largest study out there breast cancer was not increased in the estrogen only group but was increased in the estrogen progesterone group which is women who still have a uterus and need the progesterone.

## 2018-09-26 ENCOUNTER — Other Ambulatory Visit: Payer: Self-pay

## 2018-09-29 ENCOUNTER — Other Ambulatory Visit: Payer: Self-pay

## 2018-09-29 ENCOUNTER — Ambulatory Visit (INDEPENDENT_AMBULATORY_CARE_PROVIDER_SITE_OTHER): Payer: 59 | Admitting: Gynecology

## 2018-09-29 ENCOUNTER — Encounter: Payer: Self-pay | Admitting: Gynecology

## 2018-09-29 VITALS — BP 120/80 | Ht 63.0 in | Wt 176.0 lb

## 2018-09-29 DIAGNOSIS — Z01419 Encounter for gynecological examination (general) (routine) without abnormal findings: Secondary | ICD-10-CM

## 2018-09-29 DIAGNOSIS — Z79890 Hormone replacement therapy (postmenopausal): Secondary | ICD-10-CM | POA: Diagnosis not present

## 2018-09-29 DIAGNOSIS — R635 Abnormal weight gain: Secondary | ICD-10-CM

## 2018-09-29 MED ORDER — ESTRADIOL 0.5 MG PO TABS: 0.5000 mg | ORAL_TABLET | Freq: Every day | ORAL | 4 refills | Status: DC

## 2018-09-29 NOTE — Progress Notes (Signed)
    Kelly Lawrence 07/18/1965 [id number]        53 y.o.  G2P2 for annual gynecologic exam.  History of LAVH BSO 2019.  Had been on estradiol 0.5 mg but stopped it last month because of weight gain of 10 pounds this past year, 20 pounds over the last 2 years.  Not having significant hot flashes or sweats.  Past medical history,surgical history, problem list, medications, allergies, family history and social history were all reviewed and documented as reviewed in the EPIC chart.  ROS:  Performed with pertinent positives and negatives included in the history, assessment and plan.   Additional significant findings : None   Exam: [name] assistant Vitals:   09/29/18 1534  BP: 120/80  Weight: 176 lb (79.8 kg)  Height: 5\' 3"  (1.6 m)   Body mass index is 31.18 kg/m.  General appearance:  Normal affect, orientation and appearance. Skin: Grossly normal HEENT: Without gross lesions.  No cervical or supraclavicular adenopathy. Thyroid normal.  Lungs:  Clear without wheezing, rales or rhonchi Cardiac: RR, without RMG Abdominal:  Soft, nontender, without masses, guarding, rebound, organomegaly or hernia Breasts:  Examined lying and sitting without masses, retractions, discharge or axillary adenopathy. Pelvic:  Ext, BUS, Vagina: Normal  Adnexa: Without masses or tenderness    Anus and perineum: Normal   Rectovaginal: Normal sphincter tone without palpated masses or tenderness.    Assessment/Plan:  53 y.o. G2P2 female for annual gynecologic exam.   1. Status post LAVH BSO for menorrhagia.  Had been on estradiol 0.5 mg but discontinued due to weight gain.  Not having significant hot flushes and sweats.  We discussed the benefits of HRT started early to include possible cardiovascular and bone health versus the risks to include thrombosis in the breast cancer issue.  Whether to restart now or monitor discussed.  Other reasons for weight gain and strategies for weight control also reviewed.   After lengthy discussion the patient wants to go ahead and restart on estradiol 0.5 mg and this was prescribed x1 year.  She is going to be more diligent as far as diet and exercise. 2. Mammography 01/2018.  Continue with annual mammography when due.  Breast exam normal today. 3. Pap smear 2018.  No Pap smear done today.  No history of abnormal Pap smears.  Options to stop screening based on hysterectomy history and current screening guidelines discussed.  Will readdress on an annual basis. 4. Colonoscopy never.  Recommend arranging for screening colonoscopy.  Names and numbers provided. 5. Health maintenance.  CBC, CMP and TSH ordered due to her weight gain.  She will plan on lipid profile the next time she sees her primary provider.  Follow-up 1 year, sooner as needed.   Anastasio Auerbach MD, 4:16 PM 09/29/2018

## 2018-09-29 NOTE — Patient Instructions (Signed)
Start back on the estrogen replacement as we discussed.  Call if you have any issues.  Schedule your colonoscopy with either:  [facility]   Address: [address], [place], [place] [address]  [phone]    or  [facility]  Address: [address], [place], [place] [address]  [phone]

## 2018-09-30 LAB — CBC WITH DIFFERENTIAL/PLATELET
Absolute Monocytes: 656 cells/uL (ref 200–950)
Basophils Absolute: 80 cells/uL (ref 0–200)
Basophils Relative: 1 %
Eosinophils Absolute: 440 cells/uL (ref 15–500)
Eosinophils Relative: 5.5 %
HCT: 36.7 % (ref 35.0–45.0)
Hemoglobin: 12.3 g/dL (ref 11.7–15.5)
Lymphs Abs: 2400 cells/uL (ref 850–3900)
MCH: 29.6 pg (ref 27.0–33.0)
MCHC: 33.5 g/dL (ref 32.0–36.0)
MCV: 88.4 fL (ref 80.0–100.0)
MPV: 9.9 fL (ref 7.5–12.5)
Monocytes Relative: 8.2 %
Neutro Abs: 4424 cells/uL (ref 1500–7800)
Neutrophils Relative %: 55.3 %
Platelets: 368 10*3/uL (ref 140–400)
RBC: 4.15 10*6/uL (ref 3.80–5.10)
RDW: 11.5 % (ref 11.0–15.0)
Total Lymphocyte: 30 %
WBC: 8 10*3/uL (ref 3.8–10.8)

## 2018-09-30 LAB — COMPREHENSIVE METABOLIC PANEL
AG Ratio: 1.7 (calc) (ref 1.0–2.5)
ALT: 10 U/L (ref 6–29)
AST: 17 U/L (ref 10–35)
Albumin: 4.3 g/dL (ref 3.6–5.1)
Alkaline phosphatase (APISO): 95 U/L (ref 37–153)
BUN: 15 mg/dL (ref 7–25)
CO2: 26 mmol/L (ref 20–32)
Calcium: 9.9 mg/dL (ref 8.6–10.4)
Chloride: 104 mmol/L (ref 98–110)
Creat: 0.73 mg/dL (ref 0.50–1.05)
Globulin: 2.6 g/dL (calc) (ref 1.9–3.7)
Glucose, Bld: 83 mg/dL (ref 65–99)
Potassium: 3.8 mmol/L (ref 3.5–5.3)
Sodium: 140 mmol/L (ref 135–146)
Total Bilirubin: 0.2 mg/dL (ref 0.2–1.2)
Total Protein: 6.9 g/dL (ref 6.1–8.1)

## 2018-09-30 LAB — TSH: TSH: 0.88 mIU/L

## 2018-10-06 ENCOUNTER — Encounter: Payer: Self-pay | Admitting: Gynecology

## 2018-11-19 ENCOUNTER — Encounter: Payer: Self-pay | Admitting: Gynecology

## 2018-11-24 ENCOUNTER — Other Ambulatory Visit: Payer: Self-pay | Admitting: Internal Medicine

## 2018-11-29 ENCOUNTER — Other Ambulatory Visit: Payer: Self-pay | Admitting: Internal Medicine

## 2018-11-29 MED ORDER — BUPROPION HCL ER (XL) 150 MG PO TB24: ORAL_TABLET | ORAL | 1 refills | Status: DC

## 2019-01-07 ENCOUNTER — Ambulatory Visit (INDEPENDENT_AMBULATORY_CARE_PROVIDER_SITE_OTHER): Payer: 59 | Admitting: Internal Medicine

## 2019-01-07 ENCOUNTER — Encounter: Payer: Self-pay | Admitting: Internal Medicine

## 2019-01-07 ENCOUNTER — Other Ambulatory Visit: Payer: Self-pay

## 2019-01-07 DIAGNOSIS — H9190 Unspecified hearing loss, unspecified ear: Secondary | ICD-10-CM | POA: Insufficient documentation

## 2019-01-07 DIAGNOSIS — R002 Palpitations: Secondary | ICD-10-CM | POA: Diagnosis not present

## 2019-01-07 DIAGNOSIS — F413 Other mixed anxiety disorders: Secondary | ICD-10-CM | POA: Diagnosis not present

## 2019-01-07 DIAGNOSIS — G4709 Other insomnia: Secondary | ICD-10-CM

## 2019-01-07 DIAGNOSIS — H9193 Unspecified hearing loss, bilateral: Secondary | ICD-10-CM

## 2019-01-07 MED ORDER — BUPROPION HCL ER (XL) 150 MG PO TB24: ORAL_TABLET | ORAL | 3 refills | Status: DC

## 2019-01-07 MED ORDER — CLONAZEPAM 0.5 MG PO TABS: 0.5000 mg | ORAL_TABLET | Freq: Two times a day (BID) | ORAL | 3 refills | Status: DC | PRN

## 2019-01-07 NOTE — Assessment & Plan Note (Signed)
2018 Wellbutrin SR bid changed to Wellbutrin XL 150 mg/d Clonazepam prn - dose increased

## 2019-01-07 NOTE — Progress Notes (Signed)
Subjective:  Patient ID: Kelly Lawrence, female    DOB: Dec 08, 1965  Age: 53 y.o. MRN: [id number]  CC: No chief complaint on file.   HPI SUI CRAGGS presents for med refiils  F/u depression, anxiety. Clonazepam is not working well C/o decreased hearing  Outpatient Medications Prior to Visit  Medication Sig Dispense Refill  . buPROPion (WELLBUTRIN XL) 150 MG 24 hr tablet TAKE 1 TABLET(150 MG) BY MOUTH DAILY 90 tablet 1  . clonazePAM (KLONOPIN) 0.25 MG disintegrating tablet Take 1 tablet (0.25 mg total) by mouth 2 (two) times daily as needed (anxiety, insomnia). 60 tablet 1  . estradiol (ESTRACE) 0.5 MG tablet Take 1 tablet (0.5 mg total) by mouth daily. 90 tablet 4  . Multiple Vitamin (MULTIVITAMIN) tablet Take 1 tablet by mouth daily.     . Cholecalciferol (VITAMIN D3) 2000 units capsule Take 1 capsule (2,000 Units total) by mouth daily. (Patient not taking: Reported on 01/07/2019) 100 capsule 3   No facility-administered medications prior to visit.     ROS: Review of Systems  Constitutional: Negative for activity change, appetite change, chills, fatigue and unexpected weight change.  HENT: Negative for congestion, mouth sores and sinus pressure.   Eyes: Negative for visual disturbance.  Respiratory: Negative for cough and chest tightness.   Gastrointestinal: Negative for abdominal pain and nausea.  Genitourinary: Negative for difficulty urinating, frequency and vaginal pain.  Musculoskeletal: Negative for back pain and gait problem.  Skin: Negative for pallor and rash.  Neurological: Negative for dizziness, tremors, weakness, numbness and headaches.  Psychiatric/Behavioral: Positive for sleep disturbance. Negative for confusion. The patient is nervous/anxious.     Objective:  BP 116/72 (BP Location: Right Arm, Patient Position: Sitting, Cuff Size: Large)   Pulse 74   Temp 98.2 F (36.8 C) (Oral)   Ht 5\' 3"  (1.6 m)   Wt 182 lb (82.6 kg)   LMP 08/23/2016   SpO2 99%    BMI 32.24 kg/m   BP Readings from Last 3 Encounters:  01/07/19 116/72  09/29/18 120/80  09/18/17 118/78    Wt Readings from Last 3 Encounters:  01/07/19 182 lb (82.6 kg)  09/29/18 176 lb (79.8 kg)  09/18/17 167 lb (75.8 kg)    Physical Exam Constitutional:      General: She is not in acute distress.    Appearance: She is well-developed.  HENT:     Head: Normocephalic.     Right Ear: External ear normal.     Left Ear: External ear normal.     Nose: Nose normal.  Eyes:     General:        Right eye: No discharge.        Left eye: No discharge.     Conjunctiva/sclera: Conjunctivae normal.     Pupils: Pupils are equal, round, and reactive to light.  Neck:     Musculoskeletal: Normal range of motion and neck supple.     Thyroid: No thyromegaly.     Vascular: No JVD.     Trachea: No tracheal deviation.  Cardiovascular:     Rate and Rhythm: Normal rate and regular rhythm.     Heart sounds: Normal heart sounds.  Pulmonary:     Effort: No respiratory distress.     Breath sounds: No stridor. No wheezing.  Abdominal:     General: Bowel sounds are normal. There is no distension.     Palpations: Abdomen is soft. There is no mass.     Tenderness:  There is no abdominal tenderness. There is no guarding or rebound.  Musculoskeletal:        General: No tenderness.  Lymphadenopathy:     Cervical: No cervical adenopathy.  Skin:    Findings: No erythema or rash.  Neurological:     Cranial Nerves: No cranial nerve deficit.     Motor: No abnormal muscle tone.     Coordination: Coordination normal.     Deep Tendon Reflexes: Reflexes normal.  Psychiatric:        Behavior: Behavior normal.        Thought Content: Thought content normal.        Judgment: Judgment normal.     Lab Results  Component Value Date   WBC 8.0 09/29/2018   HGB 12.3 09/29/2018   HCT 36.7 09/29/2018   PLT 368 09/29/2018   GLUCOSE 83 09/29/2018   CHOL 191 06/17/2017   TRIG 59.0 06/17/2017   HDL  81.80 06/17/2017   LDLCALC 97 06/17/2017   ALT 10 09/29/2018   AST 17 09/29/2018   NA 140 09/29/2018   K 3.8 09/29/2018   CL 104 09/29/2018   CREATININE 0.73 09/29/2018   BUN 15 09/29/2018   CO2 26 09/29/2018   TSH 0.88 09/29/2018    Mm Digital Screening Bilateral  Result Date: 02/03/2018 CLINICAL DATA:  Screening. EXAM: DIGITAL SCREENING BILATERAL MAMMOGRAM WITH CAD COMPARISON:  None. ACR Breast Density Category b: There are scattered areas of fibroglandular density. FINDINGS: There are no findings suspicious for malignancy. Images were processed with CAD. IMPRESSION: No mammographic evidence of malignancy. A result letter of this screening mammogram will be mailed directly to the patient. RECOMMENDATION: Screening mammogram in one year. (Code:SM-B-01Y) BI-RADS CATEGORY  1: Negative. Electronically Signed   By: Marin Olp M.D.   On: 02/03/2018 11:47    Assessment & Plan:    Follow-up: No follow-ups on file.  Walker Kehr, MD

## 2019-01-07 NOTE — Assessment & Plan Note (Signed)
No relapse 

## 2019-01-07 NOTE — Assessment & Plan Note (Signed)
Clonazepam prn - dose increased  Potential benefits of a long term benzodiazepines  use as well as potential risks  and complications were explained to the patient and were aknowledged.

## 2019-01-07 NOTE — Assessment & Plan Note (Signed)
Subjective.  Anterior border with urology referral offered

## 2019-01-07 NOTE — Patient Instructions (Signed)

## 2019-03-06 ENCOUNTER — Other Ambulatory Visit: Payer: Self-pay | Admitting: Internal Medicine

## 2019-03-06 DIAGNOSIS — Z1231 Encounter for screening mammogram for malignant neoplasm of breast: Secondary | ICD-10-CM

## 2019-04-08 ENCOUNTER — Ambulatory Visit: Payer: 59

## 2019-05-05 ENCOUNTER — Other Ambulatory Visit: Payer: Self-pay

## 2019-05-05 ENCOUNTER — Ambulatory Visit
Admission: RE | Admit: 2019-05-05 | Discharge: 2019-05-05 | Disposition: A | Discharge status: Home / Self Care | Payer: 59 | Source: Ambulatory Visit | Attending: Internal Medicine | Admitting: Internal Medicine

## 2019-05-05 DIAGNOSIS — Z1231 Encounter for screening mammogram for malignant neoplasm of breast: Secondary | ICD-10-CM

## 2020-01-07 ENCOUNTER — Encounter: Payer: Self-pay | Admitting: Nurse Practitioner

## 2020-01-07 ENCOUNTER — Other Ambulatory Visit: Payer: Self-pay

## 2020-01-07 ENCOUNTER — Ambulatory Visit (INDEPENDENT_AMBULATORY_CARE_PROVIDER_SITE_OTHER): Payer: Managed Care, Other (non HMO) | Admitting: Nurse Practitioner

## 2020-01-07 VITALS — BP 124/80 | Ht 63.0 in | Wt 192.0 lb

## 2020-01-07 DIAGNOSIS — Z90710 Acquired absence of both cervix and uterus: Secondary | ICD-10-CM | POA: Diagnosis not present

## 2020-01-07 DIAGNOSIS — Z01419 Encounter for gynecological examination (general) (routine) without abnormal findings: Secondary | ICD-10-CM | POA: Diagnosis not present

## 2020-01-07 DIAGNOSIS — Z79890 Hormone replacement therapy (postmenopausal): Secondary | ICD-10-CM | POA: Diagnosis not present

## 2020-01-07 MED ORDER — ESTRADIOL 0.5 MG PO TABS: 0.5000 mg | ORAL_TABLET | Freq: Every day | ORAL | 3 refills | Status: DC

## 2020-01-07 NOTE — Progress Notes (Signed)
   Kelly Lawrence 13-May-1965 [id number]   History:  54 y.o. G2P2 presents for annual exam. 2019 LAVH BSO for menorrhagia. Had ablation in 2018 with continued heavy bleeding. Normal pap and mammogram history. Has had a 40-50 pound weight gain in in the last 2-3 years. Was started on Estrace a couple of times but was worried this was contributing to weight gain. She plans to meet with Health Coach today. Labs done with PCP recently-elevated A1C in prediabetic range and slightly elevated LDL.   Gynecologic History Patient's last menstrual period was 08/23/2016.   Contraception: status post hysterectomy Last Pap: 08/24/2016. Results were: normal Last mammogram: 05/06/2019. Results were: normal Last colonoscopy: Never  Past medical history, past surgical history, family history and social history were all reviewed and documented in the EPIC chart.  ROS:  A ROS was performed and pertinent positives and negatives are included.  Exam:  Vitals:   01/07/20 0901  BP: 124/80  Weight: 192 lb (87.1 kg)  Height: 5\' 3"  (1.6 m)   Body mass index is 34.01 kg/m.  General appearance:  Normal Thyroid:  Symmetrical, normal in size, without palpable masses or nodularity. Respiratory  Auscultation:  Clear without wheezing or rhonchi Cardiovascular  Auscultation:  Regular rate, without rubs, murmurs or gallops  Edema/varicosities:  Not grossly evident Abdominal  Soft,nontender, without masses, guarding or rebound.  Liver/spleen:  No organomegaly noted  Hernia:  None appreciated  Skin  Inspection:  Grossly normal   Breasts: Examined lying and sitting.   Right: Without masses, retractions, discharge or axillary adenopathy.   Left: Without masses, retractions, discharge or axillary adenopathy. Gentitourinary   Inguinal/mons:  Normal without inguinal adenopathy  External genitalia:  Normal  BUS/Urethra/Skene's glands:  Normal  Vagina:  Normal  Cervix:  Absent  Uterus:   Absent  Adnexa/parametria:     Rt: Without masses or tenderness.   Lt: Without masses or tenderness.  Anus and perineum: Normal  Digital rectal exam: Normal sphincter tone without palpated masses or tenderness  Assessment/Plan:  54 y.o. G2P2 for annual exam.   Well female exam with routine gynecological exam - Education provided on SBEs, importance of preventative screenings, current guidelines, high calcium diet, regular exercise, and multivitamin daily. Labs with PCP-she brought these with her today and we reviewed them. Plans to meet with Health Coach today to discuss elevated lab work/weight gain.   History of LAVH - with BSO. 2019 for menorrhagia despite ablation.   Hormone replacement therapy (HRT) - Plan: estradiol (ESTRACE) 0.5 MG tablet daily. We discussed the benefits of bone and heart health and relief of menopausal symptoms as well as the risk for heart attack, stroke, and breast cancer. She is hesitant to start as she worries it will affect her weight since she has been struggling with weight gain over the last couple of years since hysterectomy.   Screening for cervical cancer - Normal pap history. Will repeat at 10-year interval and if normal will stop screenings at that time.   Screening for breast cancer - Normal mammogram history. Continue annual screenings. Normal breast exam today.   Screening for colon cancer - has not had screening colonoscopy. Recommended screenings to begin at age 19 and recommend she schedule this soon.   Follow up in 1 year annual.        Tamela Gammon [facility], 9:21 AM 01/07/2020

## 2020-01-07 NOTE — Patient Instructions (Addendum)
Health Maintenance for Postmenopausal Women Menopause is a normal process in which your ability to get pregnant comes to an end. This process happens slowly over many months or years, usually between the ages of 48 and 55. Menopause is complete when you have missed your menstrual periods for 12 months. It is important to talk with your health care provider about some of the most common conditions that affect women after menopause (postmenopausal women). These include heart disease, cancer, and bone loss (osteoporosis). Adopting a healthy lifestyle and getting preventive care can help to promote your health and wellness. The actions you take can also lower your chances of developing some of these common conditions. What should I know about menopause? During menopause, you may get a number of symptoms, such as:  Hot flashes. These can be moderate or severe.  Night sweats.  Decrease in sex drive.  Mood swings.  Headaches.  Tiredness.  Irritability.  Memory problems.  Insomnia. Choosing to treat or not to treat these symptoms is a decision that you make with your health care provider. Do I need hormone replacement therapy?  Hormone replacement therapy is effective in treating symptoms that are caused by menopause, such as hot flashes and night sweats.  Hormone replacement carries certain risks, especially as you become older. If you are thinking about using estrogen or estrogen with progestin, discuss the benefits and risks with your health care provider. What is my risk for heart disease and stroke? The risk of heart disease, heart attack, and stroke increases as you age. One of the causes may be a change in the body's hormones during menopause. This can affect how your body uses dietary fats, triglycerides, and cholesterol. Heart attack and stroke are medical emergencies. There are many things that you can do to help prevent heart disease and stroke. Watch your blood pressure  High  blood pressure causes heart disease and increases the risk of stroke. This is more likely to develop in people who have high blood pressure readings, are of African descent, or are overweight.  Have your blood pressure checked: ? Every 3-5 years if you are 18-39 years of age. ? Every year if you are 40 years old or older. Eat a healthy diet   Eat a diet that includes plenty of vegetables, fruits, low-fat dairy products, and lean protein.  Do not eat a lot of foods that are high in solid fats, added sugars, or sodium. Get regular exercise Get regular exercise. This is one of the most important things you can do for your health. Most adults should:  Try to exercise for at least 150 minutes each week. The exercise should increase your heart rate and make you sweat (moderate-intensity exercise).  Try to do strengthening exercises at least twice each week. Do these in addition to the moderate-intensity exercise.  Spend less time sitting. Even light physical activity can be beneficial. Other tips  Work with your health care provider to achieve or maintain a healthy weight.  Do not use any products that contain nicotine or tobacco, such as cigarettes, e-cigarettes, and chewing tobacco. If you need help quitting, ask your health care provider.  Know your numbers. Ask your health care provider to check your cholesterol and your blood sugar (glucose). Continue to have your blood tested as directed by your health care provider. Do I need screening for cancer? Depending on your health history and family history, you may need to have cancer screening at different stages of your life. This   may include screening for:  Breast cancer.  Cervical cancer.  Lung cancer.  Colorectal cancer. What is my risk for osteoporosis? After menopause, you may be at increased risk for osteoporosis. Osteoporosis is a condition in which bone destruction happens more quickly than new bone creation. To help prevent  osteoporosis or the bone fractures that can happen because of osteoporosis, you may take the following actions:  If you are 29-73 years old, get at least 1,000 mg of calcium and at least 600 mg of vitamin D per day.  If you are older than age 79 but younger than age 47, get at least 1,200 mg of calcium and at least 600 mg of vitamin D per day.  If you are older than age 67, get at least 1,200 mg of calcium and at least 800 mg of vitamin D per day. Smoking and drinking excessive alcohol increase the risk of osteoporosis. Eat foods that are rich in calcium and vitamin D, and do weight-bearing exercises several times each week as directed by your health care provider. How does menopause affect my mental health? Depression may occur at any age, but it is more common as you become older. Common symptoms of depression include:  Low or sad mood.  Changes in sleep patterns.  Changes in appetite or eating patterns.  Feeling an overall lack of motivation or enjoyment of activities that you previously enjoyed.  Frequent crying spells. Talk with your health care provider if you think that you are experiencing depression. General instructions See your health care provider for regular wellness exams and vaccines. This may include:  Scheduling regular health, dental, and eye exams.  Getting and maintaining your vaccines. These include: ? Influenza vaccine. Get this vaccine each year before the flu season begins. ? Pneumonia vaccine. ? Shingles vaccine. ? Tetanus, diphtheria, and pertussis (Tdap) booster vaccine. Your health care provider may also recommend other immunizations. Tell your health care provider if you have ever been abused or do not feel safe at home. Summary  Menopause is a normal process in which your ability to get pregnant comes to an end.  This condition causes hot flashes, night sweats, decreased interest in sex, mood swings, headaches, or lack of sleep.  Treatment for this  condition may include hormone replacement therapy.  Take actions to keep yourself healthy, including exercising regularly, eating a healthy diet, watching your weight, and checking your blood pressure and blood sugar levels.  Get screened for cancer and depression. Make sure that you are up to date with all your vaccines. This information is not intended to replace advice given to you by your health care provider. Make sure you discuss any questions you have with your health care provider. Document Revised: 02/05/2018 Document Reviewed: 02/05/2018 Elsevier Patient Education  2020 [employer].   Menopause and Hormone Replacement Therapy Menopause is a normal time of life when menstrual periods stop completely and the ovaries stop producing the female hormones estrogen and progesterone. This lack of hormones can affect your health and cause undesirable symptoms. Hormone replacement therapy (HRT) can relieve some of those symptoms. What is hormone replacement therapy? HRT is the use of artificial (synthetic) hormones to replace hormones that your body has stopped producing because you have reached menopause. What are my options for HRT?  HRT may consist of the synthetic hormones estrogen and progestin, or it may consist of only estrogen (estrogen-only therapy). You and your health care provider will decide which form of HRT is best for  you. If you choose to be on HRT and you have a uterus, estrogen and progestin are usually prescribed. Estrogen-only therapy is used for women who do not have a uterus. Possible options for taking HRT include:  Pills.  Patches.  Gels.  Sprays.  Vaginal cream.  Vaginal rings.  Vaginal inserts. The amount of hormone(s) that you take and how long you take the hormone(s) varies according to your health. It is important to:  Begin HRT with the lowest possible dosage.  Stop HRT as soon as your health care provider tells you to stop.  Work with your  health care provider so that you feel informed and comfortable with your decisions. What are the benefits of HRT? HRT can reduce the frequency and severity of menopausal symptoms. Benefits of HRT vary according to the kind of symptoms that you have, how severe they are, and your overall health. HRT may help to improve the following symptoms of menopause:  Hot flashes and night sweats. These are sudden feelings of heat that spread over the face and body. The skin may turn red, like a blush. Night sweats are hot flashes that happen while you are sleeping or trying to sleep.  Bone loss (osteoporosis). The body loses calcium more quickly after menopause, causing the bones to become weaker. This can increase the risk for bone breaks (fractures).  Vaginal dryness. The lining of the vagina can become thin and dry, which can cause pain during sex or cause infection, burning, or itching.  Urinary tract infections.  Urinary incontinence. This is the inability to control when you pass urine.  Irritability.  Short-term memory problems. What are the risks of HRT? Risks of HRT vary depending on your individual health and medical history. Risks of HRT also depend on whether you receive both estrogen and progestin or you receive estrogen only. HRT may increase the risk of:  Spotting. This is when a small amount of blood leaks from the vagina unexpectedly.  Endometrial cancer. This cancer is in the lining of the uterus (endometrium).  Breast cancer.  Increased density of breast tissue. This can make it harder to find breast cancer on a breast X-ray (mammogram).  Stroke.  Heart disease.  Blood clots.  Gallbladder disease.  Liver disease. Risks of HRT can increase if you have any of the following conditions:  Endometrial cancer.  Liver disease.  Heart disease.  Breast cancer.  History of blood clots.  History of stroke. Follow these instructions at home:  Take over-the-counter and  prescription medicines only as told by your health care provider.  Get mammograms, pelvic exams, and medical checkups as often as told by your health care provider.  Have Pap tests done as often as told by your health care provider. A Pap test is sometimes called a Pap smear. It is a screening test that is used to check for signs of cancer of the cervix and vagina. A Pap test can also identify the presence of infection or precancerous changes. Pap tests may be done: ? Every 3 years, starting at age 31. ? Every 5 years, starting after age 43, in combination with testing for human papillomavirus (HPV). ? More often or less often depending on other medical conditions you have, your age, and other risk factors.  It is up to you to get the results of your Pap test. Ask your health care provider, or the department that is doing the test, when your results will be ready.  Keep all follow-up visits  as told by your health care provider. This is important. Contact a health care provider if you have:  Pain or swelling in your legs.  Shortness of breath.  Chest pain.  Lumps or changes in your breasts or armpits.  Slurred speech.  Pain, burning, or bleeding when you urinate.  Unusual vaginal bleeding.  Dizziness or headaches.  Weakness or numbness in any part of your arms or legs.  Pain in your abdomen. Summary  Menopause is a normal time of life when menstrual periods stop completely and the ovaries stop producing the female hormones estrogen and progesterone.  Hormone replacement therapy (HRT) can relieve some of the symptoms of menopause.  HRT can reduce the frequency and severity of menopausal symptoms.  Risks of HRT vary depending on your individual health and medical history. This information is not intended to replace advice given to you by your health care provider. Make sure you discuss any questions you have with your health care provider. Document Revised: 10/15/2017 Document  Reviewed: 10/15/2017 Elsevier Patient Education  2020 [employer].

## 2020-01-27 LAB — COLOGUARD: COLOGUARD: NEGATIVE

## 2020-03-28 ENCOUNTER — Other Ambulatory Visit: Payer: Self-pay | Admitting: Internal Medicine

## 2020-03-28 DIAGNOSIS — Z1231 Encounter for screening mammogram for malignant neoplasm of breast: Secondary | ICD-10-CM

## 2020-05-05 LAB — HM MAMMOGRAPHY

## 2020-05-09 ENCOUNTER — Encounter: Payer: Self-pay | Admitting: Internal Medicine

## 2020-05-17 ENCOUNTER — Ambulatory Visit: Payer: 59

## 2020-05-18 ENCOUNTER — Ambulatory Visit: Payer: 59

## 2020-12-06 ENCOUNTER — Other Ambulatory Visit: Payer: Self-pay

## 2020-12-06 ENCOUNTER — Ambulatory Visit (INDEPENDENT_AMBULATORY_CARE_PROVIDER_SITE_OTHER): Payer: Managed Care, Other (non HMO) | Admitting: Internal Medicine

## 2020-12-06 ENCOUNTER — Encounter: Payer: Self-pay | Admitting: Internal Medicine

## 2020-12-06 VITALS — BP 120/82 | HR 69 | Temp 98.2°F | Ht 63.0 in | Wt 187.4 lb

## 2020-12-06 DIAGNOSIS — R197 Diarrhea, unspecified: Secondary | ICD-10-CM | POA: Diagnosis not present

## 2020-12-06 DIAGNOSIS — R002 Palpitations: Secondary | ICD-10-CM

## 2020-12-06 DIAGNOSIS — R7303 Prediabetes: Secondary | ICD-10-CM

## 2020-12-06 DIAGNOSIS — R635 Abnormal weight gain: Secondary | ICD-10-CM

## 2020-12-06 DIAGNOSIS — N921 Excessive and frequent menstruation with irregular cycle: Secondary | ICD-10-CM | POA: Diagnosis not present

## 2020-12-06 DIAGNOSIS — J4520 Mild intermittent asthma, uncomplicated: Secondary | ICD-10-CM

## 2020-12-06 DIAGNOSIS — Z0000 Encounter for general adult medical examination without abnormal findings: Secondary | ICD-10-CM

## 2020-12-06 LAB — COMPREHENSIVE METABOLIC PANEL
ALT: 15 U/L (ref 0–35)
AST: 20 U/L (ref 0–37)
Albumin: 4.5 g/dL (ref 3.5–5.2)
Alkaline Phosphatase: 79 U/L (ref 39–117)
BUN: 17 mg/dL (ref 6–23)
CO2: 29 mEq/L (ref 19–32)
Calcium: 9.9 mg/dL (ref 8.4–10.5)
Chloride: 103 mEq/L (ref 96–112)
Creatinine, Ser: 0.65 mg/dL (ref 0.40–1.20)
GFR: 100.59 mL/min (ref >60.00)
Glucose, Bld: 92 mg/dL (ref 70–99)
Potassium: 4 mEq/L (ref 3.5–5.1)
Sodium: 138 mEq/L (ref 135–145)
Total Bilirubin: 0.5 mg/dL (ref 0.2–1.2)
Total Protein: 7.8 g/dL (ref 6.0–8.3)

## 2020-12-06 LAB — CBC WITH DIFFERENTIAL/PLATELET
Basophils Absolute: 0.1 10*3/uL (ref 0.0–0.1)
Basophils Relative: 0.9 % (ref 0.0–3.0)
Eosinophils Absolute: 0.3 10*3/uL (ref 0.0–0.7)
Eosinophils Relative: 4.2 % (ref 0.0–5.0)
HCT: 39.4 % (ref 36.0–46.0)
Hemoglobin: 13.1 g/dL (ref 12.0–15.0)
Lymphocytes Relative: 38.2 % (ref 12.0–46.0)
Lymphs Abs: 2.5 10*3/uL (ref 0.7–4.0)
MCHC: 33.2 g/dL (ref 30.0–36.0)
MCV: 88.1 fl (ref 78.0–100.0)
Monocytes Absolute: 0.5 10*3/uL (ref 0.1–1.0)
Monocytes Relative: 7.5 % (ref 3.0–12.0)
Neutro Abs: 3.2 10*3/uL (ref 1.4–7.7)
Neutrophils Relative %: 49.2 % (ref 43.0–77.0)
Platelets: 335 10*3/uL (ref 150.0–400.0)
RBC: 4.47 Mil/uL (ref 3.87–5.11)
RDW: 12.5 % (ref 11.5–15.5)
WBC: 6.6 10*3/uL (ref 4.0–10.5)

## 2020-12-06 LAB — URINALYSIS, ROUTINE W REFLEX MICROSCOPIC
Bilirubin Urine: NEGATIVE
Ketones, ur: NEGATIVE
Nitrite: NEGATIVE
RBC / HPF: NONE SEEN (ref 0–?)
Specific Gravity, Urine: <=1.005 — AB (ref 1.000–1.030)
Total Protein, Urine: NEGATIVE
Urine Glucose: NEGATIVE
Urobilinogen, UA: 0.2 (ref 0.0–1.0)
pH: 6 (ref 5.0–8.0)

## 2020-12-06 LAB — VITAMIN B12: Vitamin B-12: 519 pg/mL (ref 211–911)

## 2020-12-06 LAB — LIPID PANEL
Cholesterol: 200 mg/dL (ref 0–200)
HDL: 81.6 mg/dL (ref >39.00)
LDL Cholesterol: 104 mg/dL — ABNORMAL HIGH (ref 0–99)
NonHDL: 118.25
Total CHOL/HDL Ratio: 2
Triglycerides: 69 mg/dL (ref 0.0–149.0)
VLDL: 13.8 mg/dL (ref 0.0–40.0)

## 2020-12-06 LAB — TSH: TSH: 1.12 u[IU]/mL (ref 0.35–5.50)

## 2020-12-06 LAB — VITAMIN D 25 HYDROXY (VIT D DEFICIENCY, FRACTURES): VITD: 33.8 ng/mL (ref 30.00–100.00)

## 2020-12-06 LAB — SEDIMENTATION RATE: Sed Rate: 23 mm/hr (ref 0–30)

## 2020-12-06 MED ORDER — SACCHAROMYCES BOULARDII 250 MG PO CAPS: 250.0000 mg | ORAL_CAPSULE | Freq: Two times a day (BID) | ORAL | 1 refills | Status: DC

## 2020-12-06 MED ORDER — TIRZEPATIDE 2.5 MG/0.5ML ~~LOC~~ SOAJ: 2.5000 mg | SUBCUTANEOUS | 2 refills | Status: DC

## 2020-12-06 NOTE — Assessment & Plan Note (Signed)
Hysterectomy - complete 2019

## 2020-12-06 NOTE — Assessment & Plan Note (Addendum)
  We discussed age appropriate health related issues, including available/recomended screening tests and vaccinations. Labs were ordered to be later reviewed . All questions were answered. We discussed one or more of the following - seat belt use, use of sunscreen/sun exposure exercise, fall risk reduction, second hand smoke exposure, firearm use and storage, seat belt use, a need for adhering to healthy diet and exercise. Labs were ordered.  All questions were answered. [place] (-) 2021.  Mammogram, eye exam up-to-date Coronary calcium CT test was offered Patient declined vaccines offered

## 2020-12-06 NOTE — Assessment & Plan Note (Signed)
Discussed diet, fasting

## 2020-12-06 NOTE — Patient Instructions (Addendum)
The Obesity Code book by Sharman Cheek   These suggestions will probably help you to improve your metabolism if you are not overweight and to lose weight if you are overweight: 1.  Reduce your consumption of sugars and starches.  Eliminate high fructose corn syrup from your diet.  Reduce your consumption of processed foods.  For desserts try to have seasonal fruits, berries, nuts, cheeses or dark chocolate with more than 70% cacao. 2.  Do not snack 3.  You do not have to eat breakfast.  If you choose to have breakfast - eat plain greek yogurt, eggs, oatmeal (without sugar) - use honey if you need to. 4.  Drink water, freshly brewed unsweetened tea (green, black or herbal) or coffee.  Do not drink sodas including diet sodas , juices, beverages sweetened with artificial sweeteners. 5.  Reduce your consumption of refined grains. 6.  Avoid protein drinks such as Optifast, Slim fast etc. Eat chicken, fish, meat, dairy and beans for your sources of protein. 7.  Natural unprocessed fats like cold pressed virgin olive oil, butter, coconut oil are good for you.  Eat avocados. 8.  Increase your consumption of fiber.  Fruits, berries, vegetables, whole grains, flaxseed, chia seeds, beans, popcorn, nuts, oatmeal are good sources of fiber 9.  Use vinegar in your diet, i.e. apple cider vinegar, red wine or balsamic vinegar 10.  You can try fasting.  For example you can skip breakfast and lunch every other day (24-hour fast) 11.  Stress reduction, good night sleep, relaxation, meditation, yoga and other physical activity is likely to help you to maintain low weight too. 12.  If you drink alcohol, limit your alcohol intake to no more than 2 drinks a day.   Cardiac CT calcium scoring test $99 Tel # is [phone]   Computed tomography, more commonly known as a CT or CAT scan, is a diagnostic medical imaging test. Like traditional x-rays, it produces multiple images or pictures of the inside of the body. The  cross-sectional images generated during a CT scan can be reformatted in multiple planes. They can even generate three-dimensional images. These images can be viewed on a computer monitor, printed on film or by a 3D printer, or transferred to a CD or DVD. CT images of internal organs, bones, soft tissue and blood vessels provide greater detail than traditional x-rays, particularly of soft tissues and blood vessels. A cardiac CT scan for coronary calcium is a non-invasive way of obtaining information about the presence, location and extent of calcified plaque in the coronary arteries--the vessels that supply oxygen-containing blood to the heart muscle. Calcified plaque results when there is a build-up of fat and other substances under the inner layer of the artery. This material can calcify which signals the presence of atherosclerosis, a disease of the vessel wall, also called coronary artery disease (CAD). People with this disease have an increased risk for heart attacks. In addition, over time, progression of plaque build up (CAD) can narrow the arteries or even close off blood flow to the heart. The result may be chest pain, sometimes called "angina," or a heart attack. Because calcium is a marker of CAD, the amount of calcium detected on a cardiac CT scan is a helpful prognostic tool. The findings on cardiac CT are expressed as a calcium score. Another name for this test is coronary artery calcium scoring.  What are some common uses of the procedure? The goal of cardiac CT scan for calcium scoring is to  determine if CAD is present and to what extent, even if there are no symptoms. It is a screening study that may be recommended by a physician for patients with risk factors for CAD but no clinical symptoms. The major risk factors for CAD are: high blood cholesterol levels  family history of heart attacks  diabetes  high blood pressure  cigarette smoking  overweight or obese  physical  inactivity   A negative cardiac CT scan for calcium scoring shows no calcification within the coronary arteries. This suggests that CAD is absent or so minimal it cannot be seen by this technique. The chance of having a heart attack over the next two to five years is very low under these circumstances. A positive test means that CAD is present, regardless of whether or not the patient is experiencing any symptoms. The amount of calcification--expressed as the calcium score--may help to predict the likelihood of a myocardial infarction (heart attack) in the coming years and helps your medical doctor or cardiologist decide whether the patient may need to take preventive medicine or undertake other measures such as diet and exercise to lower the risk for heart attack. The extent of CAD is graded according to your calcium score:  Calcium Score  Presence of CAD (coronary artery disease)  0 No evidence of CAD   1-10 Minimal evidence of CAD  11-100 Mild evidence of CAD  101-400 Moderate evidence of CAD  Over 400 Extensive evidence of CAD   Coronary artery calcium (CAC) score is a strong predictor of incident coronary heart disease (CHD) and provides predictive information beyond traditional risk factors. CAC scoring is reasonable to use in the decision to withhold, postpone, or initiate statin therapy in intermediate-risk or selected borderline-risk asymptomatic adults (age 12-75 years and LDL-C >=70 to <190 mg/dL) who do not have diabetes or established atherosclerotic cardiovascular disease (ASCVD).* In intermediate-risk (10-year ASCVD risk >=7.5% to <20%) adults or selected borderline-risk (10-year ASCVD risk >=5% to <7.5%) adults in whom a CAC score is measured for the purpose of making a treatment decision the following recommendations have been made:   If CAC=0, it is reasonable to withhold statin therapy and reassess in 5 to 10 years, as long as higher risk conditions are  absent (diabetes mellitus, family history of premature CHD in first degree relatives (males <55 years; females <65 years), cigarette smoking, or LDL >=190 mg/dL).   If CAC is 1 to 99, it is reasonable to initiate statin therapy for patients >=69 years of age.   If CAC is >=100 or >=75th percentile, it is reasonable to initiate statin therapy at any age.   Cardiology referral should be considered for patients with CAC scores >=400 or >=75th percentile.   *2018 AHA/ACC/AACVPR/AAPA/ABC/ACPM/ADA/AGS/APhA/ASPC/NLA/PCNA Guideline on the Management of Blood Cholesterol: A Report of the American College of Cardiology/American Heart Association Task Force on Clinical Practice Guidelines. J Am Coll Cardiol. 2019;73(24):3168-3209.

## 2020-12-06 NOTE — Progress Notes (Signed)
Subjective:  Patient ID: Kelly Lawrence, female    DOB: 1965/12/12  Age: 55 y.o. 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  Her hemoglobin A1c in September was 5.9% at work.  Total cholesterol 200 C/o diarrhea since June -5 days out of a week, watery, explosive and unexpected.  Sometimes she has to pull over.  No nighttime loose stools.  No blood or mucus in the stool.  The patient tried no gluten and milk free diets.  No help so far. Her father died last year with heart disease and diabetes complications at 16.  He developed his diabetes and heart disease in his 14s.     Outpatient Medications Prior to Visit  Medication Sig Dispense Refill   estradiol (ESTRACE) 0.5 MG tablet Take 1 tablet (0.5 mg total) by mouth daily. (Patient not taking: Reported on 12/06/2020) 90 tablet 3   Multiple Vitamin (MULTIVITAMIN) tablet Take 1 tablet by mouth daily.  (Patient not taking: Reported on 12/06/2020)     VITAMIN D-VITAMIN K PO Take 1 tablet by mouth daily. (Patient not taking: Reported on 12/06/2020)     No facility-administered medications prior to visit.    ROS: Review of Systems  Constitutional:  Positive for unexpected weight change. Negative for activity change, appetite change, chills and fatigue.  HENT:  Negative for congestion, mouth sores and sinus pressure.   Eyes:  Negative for visual disturbance.  Respiratory:  Negative for cough and chest tightness.   Gastrointestinal:  Positive for diarrhea. Negative for abdominal distention, abdominal pain, blood in stool, nausea and vomiting.  Genitourinary:  Negative for difficulty urinating, frequency and vaginal pain.  Musculoskeletal:  Negative for back pain and gait problem.  Skin:  Negative for pallor and rash.  Neurological:  Negative for dizziness, tremors, weakness, numbness and headaches.  Psychiatric/Behavioral:   Negative for confusion and sleep disturbance.    Objective:  BP 120/82 (BP Location: Left Arm)   Pulse 69   Temp 98.2 F (36.8 C) (Oral)   Ht 5\' 3"  (1.6 m)   Wt 187 lb 6.4 oz (85 kg)   LMP 08/23/2016   SpO2 97%   BMI 33.20 kg/m   BP Readings from Last 3 Encounters:  12/06/20 120/82  01/07/20 124/80  01/07/19 116/72    Wt Readings from Last 3 Encounters:  12/06/20 187 lb 6.4 oz (85 kg)  01/07/20 192 lb (87.1 kg)  01/07/19 182 lb (82.6 kg)    Physical Exam Constitutional:      General: She is not in acute distress.    Appearance: She is well-developed. She is obese.  HENT:     Head: Normocephalic.     Right Ear: External ear normal.     Left Ear: External ear normal.     Nose: Nose normal.  Eyes:     General:        Right eye: No discharge.        Left eye: No discharge.     Conjunctiva/sclera: Conjunctivae normal.     Pupils: Pupils are equal, round, and reactive to light.  Neck:     Thyroid: No thyromegaly.     Vascular: No JVD.     Trachea: No tracheal deviation.  Cardiovascular:     Rate and Rhythm: Normal rate and regular rhythm.     Heart sounds: Normal heart sounds.  Pulmonary:     Effort: No respiratory distress.     Breath sounds: No stridor. No wheezing.  Abdominal:     General: Bowel sounds are normal. There is no distension.     Palpations: Abdomen is soft. There is no mass.     Tenderness: There is no abdominal tenderness. There is no guarding or rebound.  Musculoskeletal:        General: No tenderness.     Cervical back: Normal range of motion and neck supple. No rigidity.  Lymphadenopathy:     Cervical: No cervical adenopathy.  Skin:    Findings: No erythema or rash.  Neurological:     Cranial Nerves: No cranial nerve deficit.     Motor: No abnormal muscle tone.     Coordination: Coordination normal.     Deep Tendon Reflexes: Reflexes normal.  Psychiatric:        Behavior: Behavior normal.        Thought Content: Thought content  normal.        Judgment: Judgment normal.   I spent 22 minutes in addition to time for CPX wellness examination in preparing to see the patient by review of recent labs, imaging and procedures, obtaining and reviewing separately obtained history, communicating with the patient, ordering medications, tests or procedures, and documenting clinical information in the EHR including the differential diagnosis, treatment, and any further evaluation and other management of weight gain, diarrhea, hyperglycemia.        Lab Results  Component Value Date   WBC 6.6 12/06/2020   HGB 13.1 12/06/2020   HCT 39.4 12/06/2020   PLT 335.0 12/06/2020   GLUCOSE 92 12/06/2020   CHOL 200 12/06/2020   TRIG 69.0 12/06/2020   HDL 81.60 12/06/2020   LDLCALC 104 (H) 12/06/2020   ALT 15 12/06/2020   AST 20 12/06/2020   NA 138 12/06/2020   K 4.0 12/06/2020   CL 103 12/06/2020   CREATININE 0.65 12/06/2020   BUN 17 12/06/2020   CO2 29 12/06/2020   TSH 1.12 12/06/2020    MM DIGITAL SCREENING BILATERAL  Result Date: 05/06/2019 CLINICAL DATA:  Screening. EXAM: DIGITAL SCREENING BILATERAL MAMMOGRAM WITH CAD COMPARISON:  Previous exam(s). ACR Breast Density Category b: There are scattered areas of fibroglandular density. FINDINGS: There are no findings suspicious for malignancy. Images were processed with CAD. IMPRESSION: No mammographic evidence of malignancy. A result letter of this screening mammogram will be mailed directly to the patient. RECOMMENDATION: Screening mammogram in one year. (Code:SM-B-01Y) BI-RADS CATEGORY  1: Negative. Electronically Signed   By: Claudie Revering M.D.   On: 05/06/2019 10:19    Assessment & Plan:   Problem List Items Addressed This Visit     Asthma    Doing well      Diarrhea    New.  Unclear etiology.   Start Florastor p.o. Lactose free diet.  Continue with gluten-free diet.  Obtain lab work including sed rate Try Imodium as needed      Relevant Orders   Vitamin B12  (Completed)   VITAMIN D 25 Hydroxy (Vit-D Deficiency, Fractures) (Completed)   Sedimentation rate (Completed)   Menorrhagia with irregular cycle    Hysterectomy - complete 2019      Palpitations    Minor.  No need to treat at present.  Obtain TSH      Prediabetes    Start Mounjaro.  Diet, intermittent fasting discussed      Weight gain    Discussed diet, fasting  Well adult exam - Primary     We discussed age appropriate health related issues, including available/recomended screening tests and vaccinations. Labs were ordered to be later reviewed . All questions were answered. We discussed one or more of the following - seat belt use, use of sunscreen/sun exposure exercise, fall risk reduction, second hand smoke exposure, firearm use and storage, seat belt use, a need for adhering to healthy diet and exercise. Labs were ordered.  All questions were answered. [place] (-) 2021.  Mammogram, eye exam up-to-date Coronary calcium CT test was offered Patient declined vaccines offered        Relevant Orders   TSH (Completed)   Urinalysis   CBC with Differential/Platelet (Completed)   Lipid panel (Completed)   Comprehensive metabolic panel (Completed)   Vitamin B12 (Completed)   VITAMIN D 25 Hydroxy (Vit-D Deficiency, Fractures) (Completed)   Sedimentation rate (Completed)      Follow-up: Return in about 2 months (around 02/05/2021) for a follow-up visit.  Walker Kehr, MD

## 2020-12-06 NOTE — Assessment & Plan Note (Signed)
Doing well 

## 2020-12-06 NOTE — Assessment & Plan Note (Signed)
Minor.  No need to treat at present.  Obtain TSH

## 2020-12-06 NOTE — Assessment & Plan Note (Addendum)
Start Mounjaro.  Diet, intermittent fasting discussed

## 2020-12-06 NOTE — Assessment & Plan Note (Addendum)
New.  Unclear etiology.   Start Florastor p.o. Lactose free diet.  Continue with gluten-free diet.  Obtain lab work including sed rate Try Imodium as needed

## 2020-12-09 NOTE — Telephone Encounter (Addendum)
Submitted PA for Tirepatide injection w/ Key: [id number]). Waiting on insurance determination.Marland KitchenJohny Chess

## 2020-12-12 NOTE — Telephone Encounter (Signed)
Rec'd PA back med was denied. It states must try 3 of the formulary alternatives which are Ozempic, Rybelus, Trulicity or Victoza. Pls advise.Marland KitchenJohny Chess

## 2020-12-13 ENCOUNTER — Telehealth: Payer: Self-pay | Admitting: Internal Medicine

## 2020-12-13 NOTE — Telephone Encounter (Signed)
Patient calling in to see when provider will be sending over the Ozempic rx to pharmacy   Pharmacy:   [facility], [place]      Phone:             [phone] Fax:     [phone]

## 2020-12-13 NOTE — Telephone Encounter (Signed)
Patient calling in to see when provider will be sending over the Ozempic rx to pharmacy  Pharmacy:  [facility], [place]  Phone:  [phone] Fax:  [phone]

## 2020-12-15 ENCOUNTER — Telehealth: Payer: Self-pay

## 2020-12-15 MED ORDER — OZEMPIC (0.25 OR 0.5 MG/DOSE) 2 MG/1.5ML ~~LOC~~ SOPN: 0.5000 mg | PEN_INJECTOR | SUBCUTANEOUS | 2 refills | Status: DC

## 2020-12-15 NOTE — Telephone Encounter (Signed)
Notified pt will leave sample for pick-up. Will be in the insulin Fridge for pick-up.Marland KitchenJohny Lawrence

## 2020-12-15 NOTE — Telephone Encounter (Signed)
Pt calling to notified that the pharmacy is out of [place] work if we have samples or could something else be written.

## 2020-12-15 NOTE — Telephone Encounter (Signed)
Okay.  Thanks.

## 2021-01-05 ENCOUNTER — Other Ambulatory Visit: Payer: Self-pay

## 2021-01-05 ENCOUNTER — Encounter: Payer: Self-pay | Admitting: Nurse Practitioner

## 2021-01-05 ENCOUNTER — Ambulatory Visit (INDEPENDENT_AMBULATORY_CARE_PROVIDER_SITE_OTHER): Payer: Managed Care, Other (non HMO) | Admitting: Nurse Practitioner

## 2021-01-05 VITALS — BP 120/70 | Ht 63.0 in | Wt 182.0 lb

## 2021-01-05 DIAGNOSIS — Z780 Asymptomatic menopausal state: Secondary | ICD-10-CM

## 2021-01-05 DIAGNOSIS — Z01419 Encounter for gynecological examination (general) (routine) without abnormal findings: Secondary | ICD-10-CM | POA: Diagnosis not present

## 2021-01-05 NOTE — Progress Notes (Signed)
   Kelly Lawrence [birth year] [id number]   History:  55 y.o. G2P2 presents for annual exam. Postmenopausal - no HRT. S/P 2019 LAVH BSO for menorrhagia. Had ablation in 2018 with continued heavy bleeding. Normal pap and mammogram history. HLD, pre-diabetes, weight loss managed by PCP. On Ozempic.   Gynecologic History Patient's last menstrual period was 08/23/2016.   Contraception/Family planning: status post hysterectomy Sexually active: Yes  Health Maintenance Last Pap: 08/24/2016. Results were: Normal Last mammogram: 05/05/2020. Results were: Normal Last colonoscopy: Never. Negative Cologuard 2021 Last Dexa: Not indicated  Past medical history, past surgical history, family history and social history were all reviewed and documented in the EPIC chart. 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   ROS:  A ROS was performed and pertinent positives and negatives are included.  Exam:  Vitals:   01/05/21 1431  BP: 120/70  Weight: 182 lb (82.6 kg)  Height: 5\' 3"  (1.6 m)    Body mass index is 32.24 kg/m.  General appearance:  Normal Thyroid:  Symmetrical, normal in size, without palpable masses or nodularity. Respiratory  Auscultation:  Clear without wheezing or rhonchi Cardiovascular  Auscultation:  Regular rate, without rubs, murmurs or gallops  Edema/varicosities:  Not grossly evident Abdominal  Soft,nontender, without masses, guarding or rebound.  Liver/spleen:  No organomegaly noted  Hernia:  None appreciated  Skin  Inspection:  Grossly normal   Breasts: Examined lying and sitting.   Right: Without masses, retractions, discharge or axillary adenopathy.   Left: Without masses, retractions, discharge or axillary adenopathy. Gentitourinary   Inguinal/mons:  Normal without inguinal adenopathy  External genitalia:  Normal  BUS/Urethra/Skene's glands:  Normal  Vagina:  Normal  Cervix:  Absent  Uterus:  Absent  Adnexa/parametria:     Rt: Without masses or tenderness.   Lt: Without  masses or tenderness.  Anus and perineum: Normal  Digital rectal exam: Declines  Assessment/Plan:  55 y.o. G2P2 for annual exam.   Well female exam with routine gynecological exam - Education provided on SBEs, importance of preventative screenings, current guidelines, high calcium diet, regular exercise, and multivitamin daily. Labs with PCP.   Postmenopausal - no HRT. Occasional night sweats. S/P 2019 LAVH BSO for menorrhagia.   Screening for cervical cancer - Normal pap history. No longer screening per guidelines.   Screening for breast cancer - Normal mammogram history. Continue annual screenings. Normal breast exam today.   Screening for colon cancer - Negative Cologuard in 2021.   Screening for osteoporosis - Average risk. Will plan DXA at age 14.   Follow up in 1 year annual.        Tamela Gammon [facility], 3:00 PM 01/05/2021

## 2021-02-06 ENCOUNTER — Ambulatory Visit: Payer: Managed Care, Other (non HMO) | Admitting: Internal Medicine

## 2021-02-06 ENCOUNTER — Other Ambulatory Visit: Payer: Self-pay

## 2021-02-06 ENCOUNTER — Encounter: Payer: Self-pay | Admitting: Internal Medicine

## 2021-02-06 DIAGNOSIS — R7303 Prediabetes: Secondary | ICD-10-CM | POA: Diagnosis not present

## 2021-02-06 DIAGNOSIS — J4520 Mild intermittent asthma, uncomplicated: Secondary | ICD-10-CM | POA: Diagnosis not present

## 2021-02-06 DIAGNOSIS — F413 Other mixed anxiety disorders: Secondary | ICD-10-CM

## 2021-02-06 MED ORDER — SEMAGLUTIDE (1 MG/DOSE) 4 MG/3ML ~~LOC~~ SOPN: 1.0000 mg | PEN_INJECTOR | SUBCUTANEOUS | 5 refills | Status: DC

## 2021-02-06 MED ORDER — VITAMIN D (ERGOCALCIFEROL) 1.25 MG (50000 UNIT) PO CAPS: 50000.0000 [IU] | ORAL_CAPSULE | ORAL | 3 refills | Status: DC

## 2021-02-06 MED ORDER — VITAMIN D3 50 MCG (2000 UT) PO CAPS: 2000.0000 [IU] | ORAL_CAPSULE | Freq: Every day | ORAL | 3 refills | Status: DC

## 2021-02-06 NOTE — Progress Notes (Signed)
Subjective:  Patient ID: Kelly Lawrence, female    DOB: 10-31-1965  Age: 55 y.o. MRN: [id number]  CC: Follow-up (2 MONTH F/U)    HPI Kelly Lawrence presents for hyperglycemia, obesity, pre-DM  Outpatient Medications Prior to Visit  Medication Sig Dispense Refill   saccharomyces boulardii (FLORASTOR) 250 MG capsule Take 1 capsule (250 mg total) by mouth 2 (two) times daily. 60 capsule 1   Semaglutide,0.25 or 0.5MG /DOS, (OZEMPIC, 0.25 OR 0.5 MG/DOSE,) 2 MG/1.5ML SOPN Inject 0.5 mg into the skin once a week. 1.5 mL 2   No facility-administered medications prior to visit.    ROS: Review of Systems  Constitutional:  Negative for activity change, appetite change, chills, fatigue and unexpected weight change.  HENT:  Negative for congestion, mouth sores and sinus pressure.   Eyes:  Negative for visual disturbance.  Respiratory:  Negative for cough and chest tightness.   Gastrointestinal:  Negative for abdominal pain and nausea.  Genitourinary:  Negative for difficulty urinating, frequency and vaginal pain.  Musculoskeletal:  Negative for back pain and gait problem.  Skin:  Negative for pallor and rash.  Neurological:  Negative for dizziness, tremors, weakness, numbness and headaches.  Psychiatric/Behavioral:  Negative for confusion and sleep disturbance.    Objective:  BP 120/84 (BP Location: Left Arm)   Pulse 70   Temp 97.7 F (36.5 C) (Oral)   Ht 5\' 3"  (1.6 m)   Wt 179 lb 3.2 oz (81.3 kg)   LMP 08/23/2016   SpO2 97%   BMI 31.74 kg/m   BP Readings from Last 3 Encounters:  02/06/21 120/84  01/05/21 120/70  12/06/20 120/82    Wt Readings from Last 3 Encounters:  02/06/21 179 lb 3.2 oz (81.3 kg)  01/05/21 182 lb (82.6 kg)  12/06/20 187 lb 6.4 oz (85 kg)    Physical Exam Constitutional:      General: She is not in acute distress.    Appearance: She is well-developed.  HENT:     Head: Normocephalic.     Right Ear: External ear normal.     Left Ear: External ear  normal.     Nose: Nose normal.  Eyes:     General:        Right eye: No discharge.        Left eye: No discharge.     Conjunctiva/sclera: Conjunctivae normal.     Pupils: Pupils are equal, round, and reactive to light.  Neck:     Thyroid: No thyromegaly.     Vascular: No JVD.     Trachea: No tracheal deviation.  Cardiovascular:     Rate and Rhythm: Normal rate and regular rhythm.     Heart sounds: Normal heart sounds.  Pulmonary:     Effort: No respiratory distress.     Breath sounds: No stridor. No wheezing.  Abdominal:     General: Bowel sounds are normal. There is no distension.     Palpations: Abdomen is soft. There is no mass.     Tenderness: There is no abdominal tenderness. There is no guarding or rebound.  Musculoskeletal:        General: No tenderness.     Cervical back: Normal range of motion and neck supple. No rigidity.  Lymphadenopathy:     Cervical: No cervical adenopathy.  Skin:    Findings: No erythema or rash.  Neurological:     Cranial Nerves: No cranial nerve deficit.     Motor: No abnormal muscle tone.  Coordination: Coordination normal.     Deep Tendon Reflexes: Reflexes normal.  Psychiatric:        Behavior: Behavior normal.        Thought Content: Thought content normal.        Judgment: Judgment normal.    Lab Results  Component Value Date   WBC 6.6 12/06/2020   HGB 13.1 12/06/2020   HCT 39.4 12/06/2020   PLT 335.0 12/06/2020   GLUCOSE 92 12/06/2020   CHOL 200 12/06/2020   TRIG 69.0 12/06/2020   HDL 81.60 12/06/2020   LDLCALC 104 (H) 12/06/2020   ALT 15 12/06/2020   AST 20 12/06/2020   NA 138 12/06/2020   K 4.0 12/06/2020   CL 103 12/06/2020   CREATININE 0.65 12/06/2020   BUN 17 12/06/2020   CO2 29 12/06/2020   TSH 1.12 12/06/2020    MM DIGITAL SCREENING BILATERAL  Result Date: 05/06/2019 CLINICAL DATA:  Screening. EXAM: DIGITAL SCREENING BILATERAL MAMMOGRAM WITH CAD COMPARISON:  Previous exam(s). ACR Breast Density Category  b: There are scattered areas of fibroglandular density. FINDINGS: There are no findings suspicious for malignancy. Images were processed with CAD. IMPRESSION: No mammographic evidence of malignancy. A result letter of this screening mammogram will be mailed directly to the patient. RECOMMENDATION: Screening mammogram in one year. (Code:SM-B-01Y) BI-RADS CATEGORY  1: Negative. Electronically Signed   By: Claudie Revering M.D.   On: 05/06/2019 10:19    Assessment & Plan:   Problem List Items Addressed This Visit     Anxiety disorder    Doing well at present      Asthma    Doing well at present      Prediabetes    It is unclear if we accomplished any weight loss yet.  Increase Ozempic to the next dose.           Meds ordered this encounter  Medications   Semaglutide, 1 MG/DOSE, 4 MG/3ML SOPN    Sig: Inject 1 mg as directed once a week.    Dispense:  3 mL    Refill:  5   DISCONTD: Cholecalciferol (VITAMIN D3) 50 MCG (2000 UT) capsule    Sig: Take 1 capsule (2,000 Units total) by mouth daily.    Dispense:  100 capsule    Refill:  3   Vitamin D, Ergocalciferol, (DRISDOL) 1.25 MG (50000 UNIT) CAPS capsule    Sig: Take 1 capsule (50,000 Units total) by mouth every 30 (thirty) days.    Dispense:  3 capsule    Refill:  3      Follow-up: Return in about 3 months (around 05/07/2021) for a follow-up visit.  Walker Kehr, MD

## 2021-02-06 NOTE — Patient Instructions (Signed)
Wt Readings from Last 3 Encounters:  02/06/21 179 lb 3.2 oz (81.3 kg)  01/05/21 182 lb (82.6 kg)  12/06/20 187 lb 6.4 oz (85 kg)

## 2021-02-06 NOTE — Assessment & Plan Note (Addendum)
It is unclear if we accomplished any weight loss yet.  Increase Ozempic to the next dose.

## 2021-02-13 NOTE — Assessment & Plan Note (Signed)
Doing well at present 

## 2021-03-24 ENCOUNTER — Encounter: Payer: Self-pay | Admitting: Internal Medicine

## 2021-03-27 ENCOUNTER — Other Ambulatory Visit: Payer: Self-pay | Admitting: Internal Medicine

## 2021-03-27 MED ORDER — TRAZODONE HCL 50 MG PO TABS: 25.0000 mg | ORAL_TABLET | Freq: Every evening | ORAL | 3 refills | Status: DC | PRN

## 2021-05-10 ENCOUNTER — Encounter: Payer: Self-pay | Admitting: Internal Medicine

## 2021-05-10 ENCOUNTER — Ambulatory Visit: Payer: Managed Care, Other (non HMO) | Admitting: Internal Medicine

## 2021-05-10 ENCOUNTER — Other Ambulatory Visit: Payer: Self-pay

## 2021-05-10 VITALS — BP 120/80 | HR 87 | Temp 98.2°F | Ht 63.0 in | Wt 179.0 lb

## 2021-05-10 DIAGNOSIS — R635 Abnormal weight gain: Secondary | ICD-10-CM | POA: Diagnosis not present

## 2021-05-10 DIAGNOSIS — K5903 Drug induced constipation: Secondary | ICD-10-CM

## 2021-05-10 DIAGNOSIS — R7303 Prediabetes: Secondary | ICD-10-CM

## 2021-05-10 DIAGNOSIS — N921 Excessive and frequent menstruation with irregular cycle: Secondary | ICD-10-CM | POA: Diagnosis not present

## 2021-05-10 DIAGNOSIS — G4709 Other insomnia: Secondary | ICD-10-CM

## 2021-05-10 DIAGNOSIS — K5900 Constipation, unspecified: Secondary | ICD-10-CM | POA: Insufficient documentation

## 2021-05-10 LAB — COMPREHENSIVE METABOLIC PANEL
ALT: 12 U/L (ref 0–35)
AST: 18 U/L (ref 0–37)
Albumin: 4.6 g/dL (ref 3.5–5.2)
Alkaline Phosphatase: 88 U/L (ref 39–117)
BUN: 16 mg/dL (ref 6–23)
CO2: 29 mEq/L (ref 19–32)
Calcium: 10.2 mg/dL (ref 8.4–10.5)
Chloride: 103 mEq/L (ref 96–112)
Creatinine, Ser: 0.73 mg/dL (ref 0.40–1.20)
GFR: 93.67 mL/min (ref >60.00)
Glucose, Bld: 79 mg/dL (ref 70–99)
Potassium: 4.3 mEq/L (ref 3.5–5.1)
Sodium: 138 mEq/L (ref 135–145)
Total Bilirubin: 0.4 mg/dL (ref 0.2–1.2)
Total Protein: 7.5 g/dL (ref 6.0–8.3)

## 2021-05-10 LAB — HEMOGLOBIN A1C: Hgb A1c MFr Bld: 5.9 % (ref 4.6–6.5)

## 2021-05-10 MED ORDER — SEMAGLUTIDE (2 MG/DOSE) 8 MG/3ML ~~LOC~~ SOPN: 2.0000 mg | PEN_INJECTOR | SUBCUTANEOUS | 3 refills | Status: DC

## 2021-05-10 NOTE — Assessment & Plan Note (Signed)
S/p Hysterectomy - complete 2019 ?

## 2021-05-10 NOTE — Assessment & Plan Note (Addendum)
Due to Ozempic ?Colace ?Senakot S ?Laxative tea ?

## 2021-05-10 NOTE — Assessment & Plan Note (Signed)
Pt lost 20 lbs ?

## 2021-05-10 NOTE — Progress Notes (Signed)
? ?Subjective:  ?Patient ID: Kelly Lawrence, female    DOB: 03-01-1965  Age: 56 y.o. MRN: [id number] ? ?CC: No chief complaint on file. ? ? ?HPI ?Kelly Lawrence presents for DM,obesity ? ?Outpatient Medications Prior to Visit  ?Medication Sig Dispense Refill  ? saccharomyces boulardii (FLORASTOR) 250 MG capsule Take 1 capsule (250 mg total) by mouth 2 (two) times daily. 60 capsule 1  ? traZODone (DESYREL) 50 MG tablet Take 0.5-1 tablets (25-50 mg total) by mouth at bedtime as needed for sleep. 30 tablet 3  ? Vitamin D, Ergocalciferol, (DRISDOL) 1.25 MG (50000 UNIT) CAPS capsule Take 1 capsule (50,000 Units total) by mouth every 30 (thirty) days. 3 capsule 3  ? Semaglutide, 1 MG/DOSE, 4 MG/3ML SOPN Inject 1 mg as directed once a week. 3 mL 5  ? ?No facility-administered medications prior to visit.  ? ? ?ROS: ?Review of Systems  ?Constitutional:  Negative for activity change, appetite change, chills, fatigue and unexpected weight change.  ?HENT:  Negative for congestion, mouth sores and sinus pressure.   ?Eyes:  Negative for visual disturbance.  ?Respiratory:  Negative for cough and chest tightness.   ?Gastrointestinal:  Negative for abdominal pain and nausea.  ?Genitourinary:  Negative for difficulty urinating, frequency and vaginal pain.  ?Musculoskeletal:  Negative for back pain and gait problem.  ?Skin:  Negative for pallor and rash.  ?Neurological:  Negative for dizziness, tremors, weakness, numbness and headaches.  ?Psychiatric/Behavioral:  Negative for confusion and sleep disturbance.   ? ?Objective:  ?BP 120/80 (BP Location: Left Arm, Patient Position: Sitting, Cuff Size: Large)   Pulse 87   Temp 98.2 ?F (36.8 ?C) (Oral)   Ht '5\' 3"'$  (1.6 m)   Wt 179 lb (81.2 kg)   LMP 08/23/2016   SpO2 94%   BMI 31.71 kg/m?  ? ?BP Readings from Last 3 Encounters:  ?05/10/21 120/80  ?02/06/21 120/84  ?01/05/21 120/70  ? ? ?Wt Readings from Last 3 Encounters:  ?05/10/21 179 lb (81.2 kg)  ?02/06/21 179 lb 3.2 oz (81.3 kg)   ?01/05/21 182 lb (82.6 kg)  ? ? ?Physical Exam ?Constitutional:   ?   General: She is not in acute distress. ?   Appearance: She is well-developed.  ?HENT:  ?   Head: Normocephalic.  ?   Right Ear: External ear normal.  ?   Left Ear: External ear normal.  ?   Nose: Nose normal.  ?Eyes:  ?   General:     ?   Right eye: No discharge.     ?   Left eye: No discharge.  ?   Conjunctiva/sclera: Conjunctivae normal.  ?   Pupils: Pupils are equal, round, and reactive to light.  ?Neck:  ?   Thyroid: No thyromegaly.  ?   Vascular: No JVD.  ?   Trachea: No tracheal deviation.  ?Cardiovascular:  ?   Rate and Rhythm: Normal rate and regular rhythm.  ?   Heart sounds: Normal heart sounds.  ?Pulmonary:  ?   Effort: No respiratory distress.  ?   Breath sounds: No stridor. No wheezing.  ?Abdominal:  ?   General: Bowel sounds are normal. There is no distension.  ?   Palpations: Abdomen is soft. There is no mass.  ?   Tenderness: There is no abdominal tenderness. There is no guarding or rebound.  ?Musculoskeletal:     ?   General: No tenderness.  ?   Cervical back: Normal range of motion and  neck supple. No rigidity.  ?Lymphadenopathy:  ?   Cervical: No cervical adenopathy.  ?Skin: ?   Findings: No erythema or rash.  ?Neurological:  ?   Cranial Nerves: No cranial nerve deficit.  ?   Motor: No abnormal muscle tone.  ?   Coordination: Coordination normal.  ?   Deep Tendon Reflexes: Reflexes normal.  ?Psychiatric:     ?   Behavior: Behavior normal.     ?   Thought Content: Thought content normal.     ?   Judgment: Judgment normal.  ? ? ?Lab Results  ?Component Value Date  ? WBC 6.6 12/06/2020  ? HGB 13.1 12/06/2020  ? HCT 39.4 12/06/2020  ? PLT 335.0 12/06/2020  ? GLUCOSE 92 12/06/2020  ? CHOL 200 12/06/2020  ? TRIG 69.0 12/06/2020  ? HDL 81.60 12/06/2020  ? LDLCALC 104 (H) 12/06/2020  ? ALT 15 12/06/2020  ? AST 20 12/06/2020  ? NA 138 12/06/2020  ? K 4.0 12/06/2020  ? CL 103 12/06/2020  ? CREATININE 0.65 12/06/2020  ? BUN 17 12/06/2020   ? CO2 29 12/06/2020  ? TSH 1.12 12/06/2020  ? ? ?MM DIGITAL SCREENING BILATERAL ? ?Result Date: 05/06/2019 ?CLINICAL DATA:  Screening. EXAM: DIGITAL SCREENING BILATERAL MAMMOGRAM WITH CAD COMPARISON:  Previous exam(s). ACR Breast Density Category b: There are scattered areas of fibroglandular density. FINDINGS: There are no findings suspicious for malignancy. Images were processed with CAD. IMPRESSION: No mammographic evidence of malignancy. A result letter of this screening mammogram will be mailed directly to the patient. RECOMMENDATION: Screening mammogram in one year. (Code:SM-B-01Y) BI-RADS CATEGORY  1: Negative. Electronically Signed   By: Claudie Revering M.D.   On: 05/06/2019 10:19  ? ? ?Assessment & Plan:  ? ?Problem List Items Addressed This Visit   ? ? Constipation  ?  Due to Ozempic ?Colace ?Senakot S ?Laxative tea ?  ?  ? Insomnia  ?  Try Valerian root ?  ?  ? Menorrhagia with irregular cycle  ?  S/p Hysterectomy - complete 2019 ?  ?  ? Prediabetes - Primary  ?   Increase Ozempic.  Diet, intermittent fasting ?Check A1c, CMET ?  ?  ? Relevant Orders  ? Comprehensive metabolic panel  ? Hemoglobin A1c  ? Weight gain  ?  Pt lost 20 lbs ?  ?  ?  ? ? ?Meds ordered this encounter  ?Medications  ? Semaglutide, 2 MG/DOSE, 8 MG/3ML SOPN  ?  Sig: Inject 2 mg as directed once a week.  ?  Dispense:  3 mL  ?  Refill:  3  ?  ? ? ?Follow-up: Return in about 3 months (around 08/10/2021) for a follow-up visit. ? ?Walker Kehr, MD ?

## 2021-05-10 NOTE — Assessment & Plan Note (Signed)
Increase Ozempic.  Diet, intermittent fasting ?Check A1c, CMET ?

## 2021-05-10 NOTE — Patient Instructions (Addendum)
Colace ?Senakot S ?Laxative tea ? ?Try Valerian root for insomnia ?

## 2021-05-10 NOTE — Assessment & Plan Note (Signed)
Try Valerian root ?

## 2021-08-21 IMAGING — MG DIGITAL SCREENING BILAT W/ CAD
4 series · 4 of 4 positions shown · non-contrast
Comparison: Previous exam(s).

CLINICAL DATA: Screening.

EXAM:
DIGITAL SCREENING BILATERAL MAMMOGRAM WITH CAD

[L MLO]
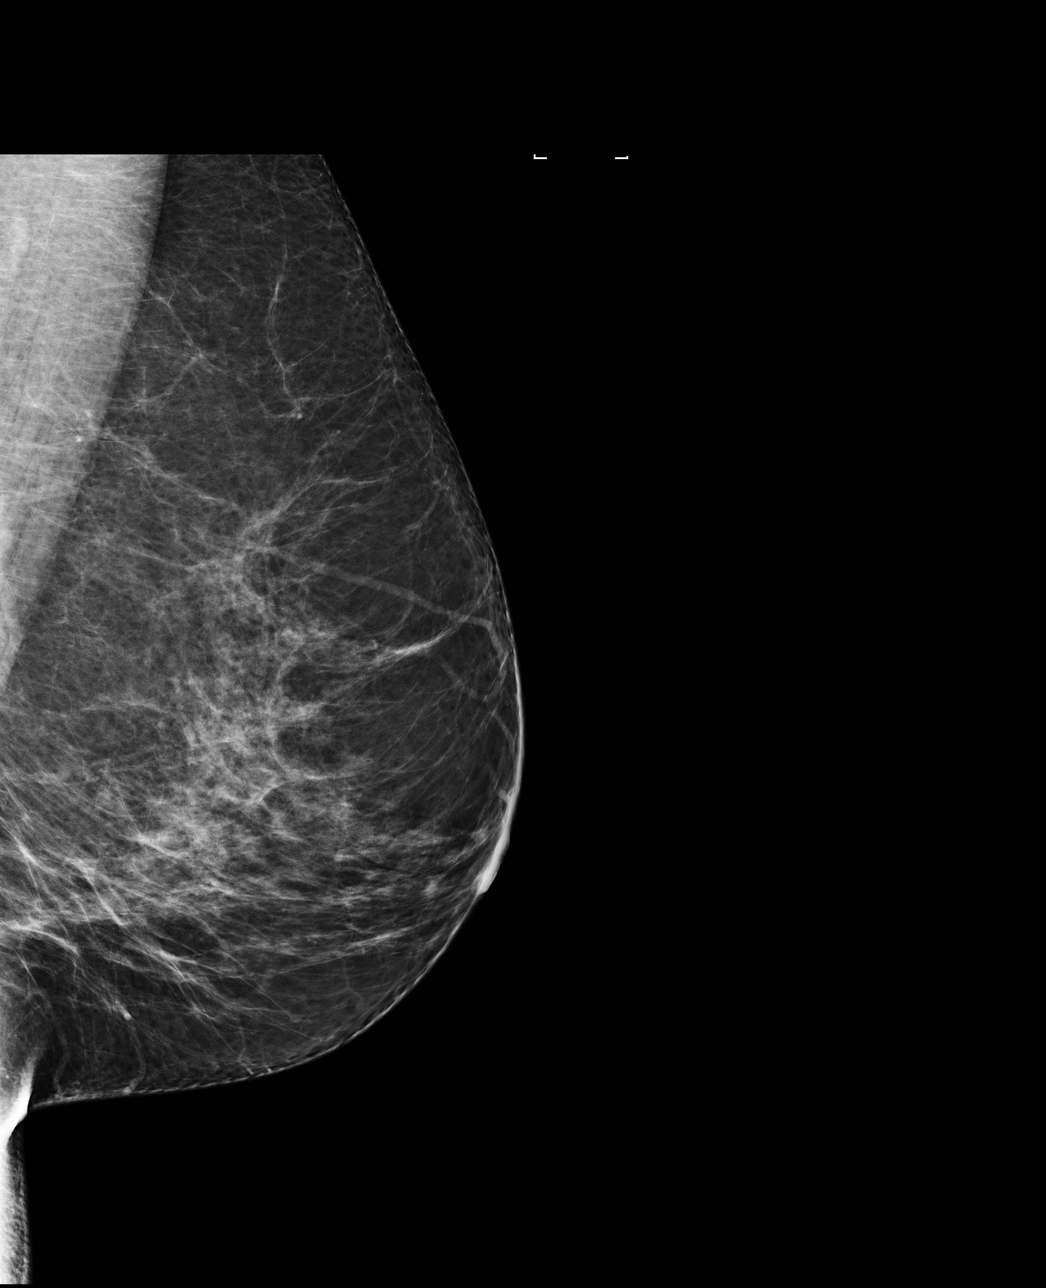

[R MLO]
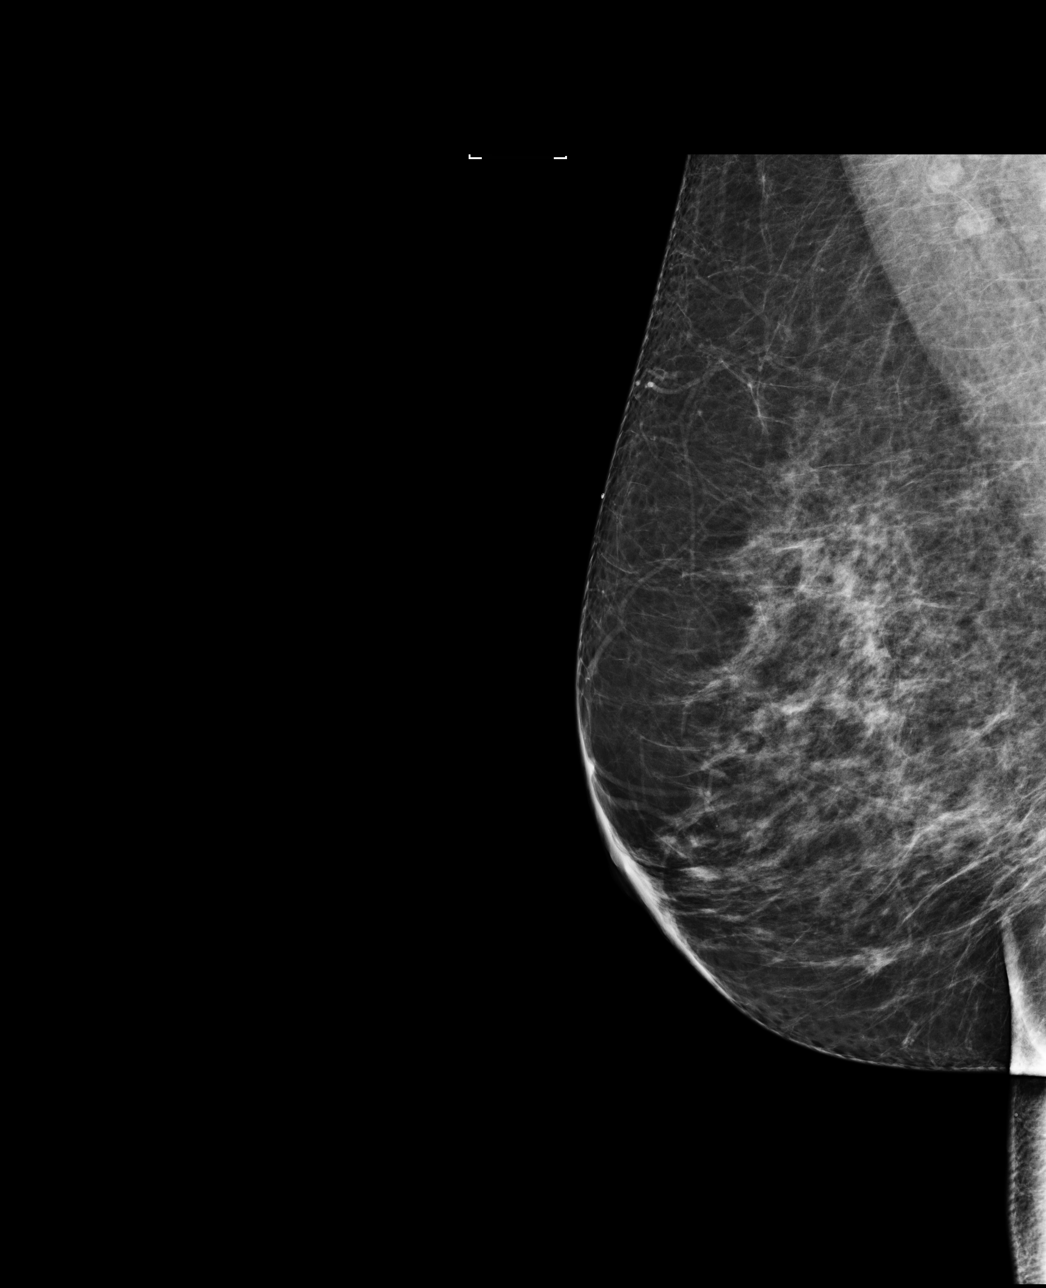

[R CC]
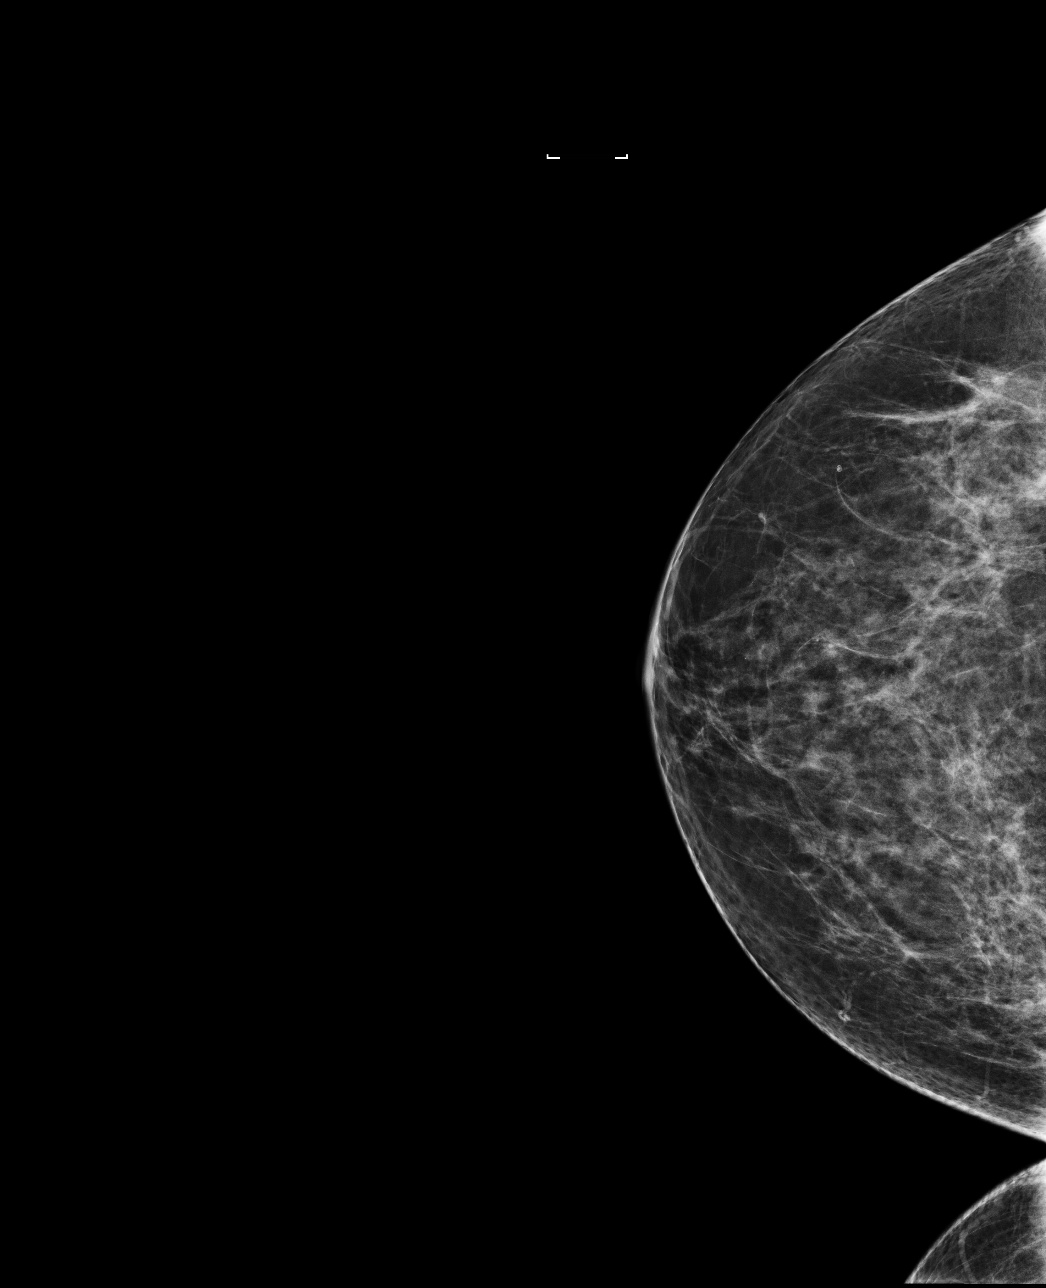

[L CC]
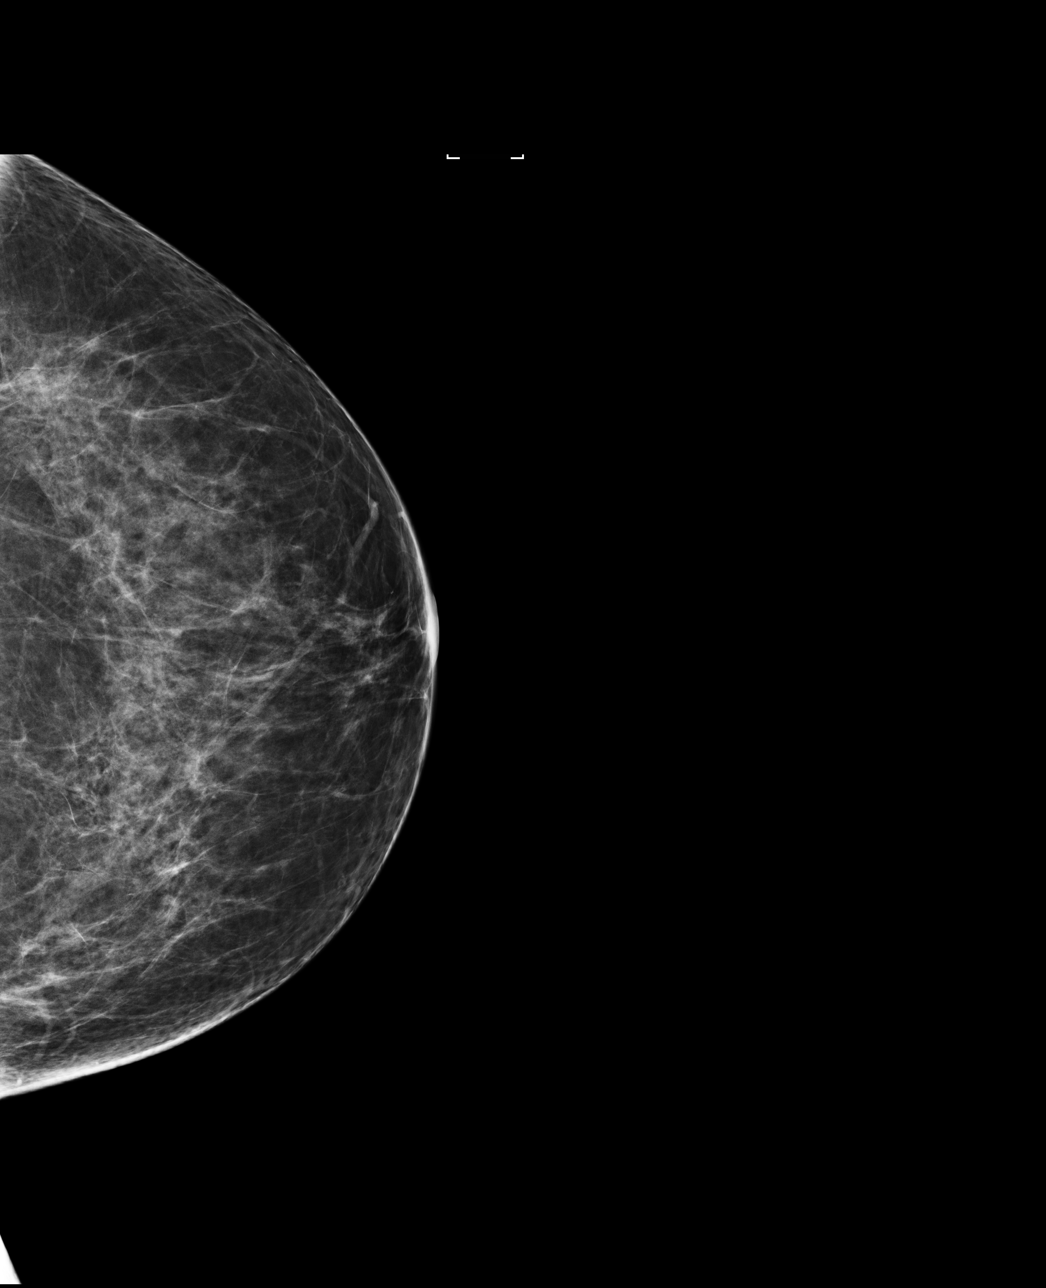

[4 of 4 positions shown; findings below may reference images not displayed]

ACR Breast Density Category b: There are scattered areas of
fibroglandular density.
FINDINGS: There are no findings suspicious for malignancy. Images were
processed with CAD.
IMPRESSION: No mammographic evidence of malignancy. A result letter of this
screening mammogram will be mailed directly to the patient.

RECOMMENDATION:
Screening mammogram in one year. (Code:AS-G-LCT)

BI-RADS CATEGORY  1: Negative.

## 2021-08-23 ENCOUNTER — Encounter: Payer: Self-pay | Admitting: Internal Medicine

## 2021-08-23 ENCOUNTER — Ambulatory Visit: Payer: Managed Care, Other (non HMO) | Admitting: Internal Medicine

## 2021-08-23 DIAGNOSIS — R7303 Prediabetes: Secondary | ICD-10-CM

## 2021-08-23 DIAGNOSIS — K5903 Drug induced constipation: Secondary | ICD-10-CM

## 2021-08-23 DIAGNOSIS — F413 Other mixed anxiety disorders: Secondary | ICD-10-CM | POA: Diagnosis not present

## 2021-08-23 DIAGNOSIS — G4709 Other insomnia: Secondary | ICD-10-CM

## 2021-08-23 MED ORDER — SEMAGLUTIDE (2 MG/DOSE) 8 MG/3ML ~~LOC~~ SOPN: 2.0000 mg | PEN_INJECTOR | SUBCUTANEOUS | 5 refills | Status: DC

## 2021-08-23 MED ORDER — ESZOPICLONE 2 MG PO TABS
2.0000 mg | ORAL_TABLET | Freq: Every evening | ORAL | 3 refills | Status: AC | PRN
Start: 2021-08-23 — End: ?

## 2021-08-23 MED ORDER — PHENTERMINE HCL 37.5 MG PO TABS
37.5000 mg | ORAL_TABLET | Freq: Every day | ORAL | 2 refills | Status: AC
Start: 2021-08-23 — End: 2021-09-22

## 2021-08-23 NOTE — Patient Instructions (Signed)
   These suggestions will probably help you to improve your metabolism if you are not overweight and to lose weight if you are overweight: 1.  Reduce your consumption of sugars and starches.  Eliminate high fructose corn syrup from your diet.  Reduce your consumption of processed foods.  For desserts try to have seasonal fruits, berries, nuts, cheeses or dark chocolate with more than 70% cacao. 2.  Do not snack 3.  You do not have to eat breakfast.  If you choose to have breakfast - eat plain greek yogurt, eggs, oatmeal (without sugar) - use honey if you need to. 4.  Drink water, freshly brewed unsweetened tea (green, black or herbal) or coffee.  Do not drink sodas including diet sodas , juices, beverages sweetened with artificial sweeteners. 5.  Reduce your consumption of refined grains. 6.  Avoid protein drinks such as Optifast, Slim fast etc. Eat chicken, fish, meat, dairy and beans for your sources of protein. 7.  Natural unprocessed fats like cold pressed virgin olive oil, butter, coconut oil are good for you.  Eat avocados. 8.  Increase your consumption of fiber.  Fruits, berries, vegetables, whole grains, flaxseed, chia seeds, beans, popcorn, nuts, oatmeal are good sources of fiber 9.  Use vinegar in your diet, i.e. apple cider vinegar, red wine or balsamic vinegar 10.  You can try fasting.  For example you can skip breakfast and lunch every other day (24-hour fast) 11.  Stress reduction, good night sleep, relaxation, meditation, yoga and other physical activity is likely to help you to maintain low weight too. 12.  If you drink alcohol, limit your alcohol intake to no more than 2 drinks a day.

## 2021-08-23 NOTE — Assessment & Plan Note (Signed)
Wellbutrin SR bid changed to Wellbutrin XL 150 mg/d Clonazepam prn

## 2021-08-23 NOTE — Assessment & Plan Note (Addendum)
On Ozempic 2 mg a week sq-reached plateau wt. The patient has been unable to lose weight in spite of using semaglutide as directed at the maximum dose. Add Phentermine po Risks of phentermine use discussed.

## 2021-08-23 NOTE — Assessment & Plan Note (Signed)
We can try Lunesta prn  Potential benefits of a long term opioids use as well as potential risks (i.e. addiction risk, apnea etc) and complications (i.e. Somnolence, constipation and others) were explained to the patient and were aknowledged.

## 2021-08-23 NOTE — Progress Notes (Signed)
Subjective:  Patient ID: Kelly Lawrence, female    DOB: 03-28-1965  Age: 57 y.o. MRN: [id number]  CC: No chief complaint on file.   HPI Kelly Lawrence presents for insomnia, pre-DM, obesity.  The patient has been unable to lose weight in spite of using semaglutide as directed at the maximum dose.  Outpatient Medications Prior to Visit  Medication Sig Dispense Refill   Vitamin D, Ergocalciferol, (DRISDOL) 1.25 MG (50000 UNIT) CAPS capsule Take 1 capsule (50,000 Units total) by mouth every 30 (thirty) days. 3 capsule 3   saccharomyces boulardii (FLORASTOR) 250 MG capsule Take 1 capsule (250 mg total) by mouth 2 (two) times daily. 60 capsule 1   Semaglutide, 2 MG/DOSE, 8 MG/3ML SOPN Inject 2 mg as directed once a week. 3 mL 3   traZODone (DESYREL) 50 MG tablet Take 0.5-1 tablets (25-50 mg total) by mouth at bedtime as needed for sleep. 30 tablet 3   No facility-administered medications prior to visit.    ROS: Review of Systems  Constitutional:  Negative for activity change, appetite change, chills, fatigue and unexpected weight change.  HENT:  Negative for congestion, mouth sores and sinus pressure.   Eyes:  Negative for visual disturbance.  Respiratory:  Negative for cough and chest tightness.   Gastrointestinal:  Negative for abdominal pain and nausea.  Genitourinary:  Negative for difficulty urinating, frequency and vaginal pain.  Musculoskeletal:  Negative for back pain and gait problem.  Skin:  Negative for pallor and rash.  Neurological:  Negative for dizziness, tremors, weakness, numbness and headaches.  Psychiatric/Behavioral:  Positive for sleep disturbance. Negative for confusion.     Objective:  BP 130/80 (BP Location: Left Arm, Patient Position: Sitting, Cuff Size: Large)   Pulse 69   Temp 98.1 F (36.7 C) (Oral)   Ht '5\' 3"'$  (1.6 m)   Wt 160 lb (72.6 kg)   LMP 08/23/2016   SpO2 98%   BMI 28.34 kg/m   BP Readings from Last 3 Encounters:  08/23/21 130/80   05/10/21 120/80  02/06/21 120/84    Wt Readings from Last 3 Encounters:  08/23/21 160 lb (72.6 kg)  05/10/21 179 lb (81.2 kg)  02/06/21 179 lb 3.2 oz (81.3 kg)    Physical Exam Constitutional:      General: She is not in acute distress.    Appearance: She is well-developed. She is obese.  HENT:     Head: Normocephalic.     Right Ear: External ear normal.     Left Ear: External ear normal.     Nose: Nose normal.  Eyes:     General:        Right eye: No discharge.        Left eye: No discharge.     Conjunctiva/sclera: Conjunctivae normal.     Pupils: Pupils are equal, round, and reactive to light.  Neck:     Thyroid: No thyromegaly.     Vascular: No JVD.     Trachea: No tracheal deviation.  Cardiovascular:     Rate and Rhythm: Normal rate and regular rhythm.     Heart sounds: Normal heart sounds.  Pulmonary:     Effort: No respiratory distress.     Breath sounds: No stridor. No wheezing.  Abdominal:     General: Bowel sounds are normal. There is no distension.     Palpations: Abdomen is soft. There is no mass.     Tenderness: There is no abdominal tenderness. There is  no guarding or rebound.  Musculoskeletal:        General: No tenderness.     Cervical back: Normal range of motion and neck supple. No rigidity.  Lymphadenopathy:     Cervical: No cervical adenopathy.  Skin:    Findings: No erythema or rash.  Neurological:     Cranial Nerves: No cranial nerve deficit.     Motor: No abnormal muscle tone.     Coordination: Coordination normal.     Deep Tendon Reflexes: Reflexes normal.  Psychiatric:        Behavior: Behavior normal.        Thought Content: Thought content normal.        Judgment: Judgment normal.     Lab Results  Component Value Date   WBC 6.6 12/06/2020   HGB 13.1 12/06/2020   HCT 39.4 12/06/2020   PLT 335.0 12/06/2020   GLUCOSE 79 05/10/2021   CHOL 200 12/06/2020   TRIG 69.0 12/06/2020   HDL 81.60 12/06/2020   LDLCALC 104 (H)  12/06/2020   ALT 12 05/10/2021   AST 18 05/10/2021   NA 138 05/10/2021   K 4.3 05/10/2021   CL 103 05/10/2021   CREATININE 0.73 05/10/2021   BUN 16 05/10/2021   CO2 29 05/10/2021   TSH 1.12 12/06/2020   HGBA1C 5.9 05/10/2021    MM DIGITAL SCREENING BILATERAL  Result Date: 05/06/2019 CLINICAL DATA:  Screening. EXAM: DIGITAL SCREENING BILATERAL MAMMOGRAM WITH CAD COMPARISON:  Previous exam(s). ACR Breast Density Category b: There are scattered areas of fibroglandular density. FINDINGS: There are no findings suspicious for malignancy. Images were processed with CAD. IMPRESSION: No mammographic evidence of malignancy. A result letter of this screening mammogram will be mailed directly to the patient. RECOMMENDATION: Screening mammogram in one year. (Code:SM-B-01Y) BI-RADS CATEGORY  1: Negative. Electronically Signed   By: Claudie Revering M.D.   On: 05/06/2019 10:19    Assessment & Plan:   Problem List Items Addressed This Visit     Anxiety disorder     Wellbutrin SR bid changed to Wellbutrin XL 150 mg/d Clonazepam prn      Constipation    Continue with Senokot S, laxative tea as needed      Insomnia    We can try Lunesta prn  Potential benefits of a long term opioids use as well as potential risks (i.e. addiction risk, apnea etc) and complications (i.e. Somnolence, constipation and others) were explained to the patient and were aknowledged.        Prediabetes    On Ozempic 2 mg a week sq-reached plateau wt. The patient has been unable to lose weight in spite of using semaglutide as directed at the maximum dose. Add Phentermine po Risks of phentermine use discussed.          Meds ordered this encounter  Medications   phentermine (ADIPEX-P) 37.5 MG tablet    Sig: Take 1 tablet (37.5 mg total) by mouth daily before breakfast.    Dispense:  30 tablet    Refill:  2   eszopiclone (LUNESTA) 2 MG TABS tablet    Sig: Take 1 tablet (2 mg total) by mouth at bedtime as needed for  sleep. Take immediately before bedtime    Dispense:  30 tablet    Refill:  3   Semaglutide, 2 MG/DOSE, 8 MG/3ML SOPN    Sig: Inject 2 mg as directed once a week.    Dispense:  3 mL    Refill:  5  Follow-up: Return in about 3 months (around 11/23/2021) for a follow-up visit.  Walker Kehr, MD

## 2021-09-01 ENCOUNTER — Telehealth: Payer: Self-pay | Admitting: *Deleted

## 2021-09-01 NOTE — Telephone Encounter (Signed)
Pt was on cover-my-meds need PA on Ozempic. Submitted PA w/ (Key: [id number]). Waiting for response on PA.Marland KitchenJohny Chess

## 2021-09-02 ENCOUNTER — Other Ambulatory Visit: Payer: Self-pay | Admitting: Internal Medicine

## 2021-09-03 NOTE — Assessment & Plan Note (Signed)
Continue with Senokot S, laxative tea as needed

## 2021-09-04 MED ORDER — OZEMPIC (2 MG/DOSE) 8 MG/3ML ~~LOC~~ SOPN: PEN_INJECTOR | SUBCUTANEOUS | 5 refills | Status: DC

## 2021-09-04 NOTE — Telephone Encounter (Signed)
Rec'd determination fax med was DENIED. It states This request has received a Unfavorable outcome. No alternatives was given...Kelly Lawrence

## 2021-09-04 NOTE — Telephone Encounter (Signed)
Faxed via biocam.. 1st Transmission to pharmacy failed (09/04/2021  9:33 AM EDT)

## 2021-09-04 NOTE — Addendum Note (Signed)
Addended by: Earnstine Regal on: 09/04/2021 10:08 AM   Modules accepted: Orders

## 2021-09-05 NOTE — Telephone Encounter (Signed)
Kelly Lawrence, it has been covered before.  Do we need to do something different?  Thanks

## 2021-09-07 ENCOUNTER — Encounter: Payer: Self-pay | Admitting: Internal Medicine

## 2021-09-07 ENCOUNTER — Other Ambulatory Visit: Payer: Self-pay | Admitting: *Deleted

## 2021-09-07 MED ORDER — OZEMPIC (2 MG/DOSE) 8 MG/3ML ~~LOC~~ SOPN: PEN_INJECTOR | SUBCUTANEOUS | 5 refills | Status: DC

## 2021-09-07 NOTE — Telephone Encounter (Signed)
Her PA was done twice both times they Denied to cover...Kelly Lawrence

## 2021-09-08 NOTE — Telephone Encounter (Signed)
Rec'd msg stating " This request has received a Cancelled outcome". There have been 3 PA's for ozempic all have been denied. Pt can do an APPEAL. She will have to list what sge had done for weight loss.. Give 3 alternatives that she had taken, and MD has to write a letter statingn why she need meds. Pt will have to mail appeal back if she is wanting med. (FYI... No insurances is covering weight loss medications and for Ozempic, Mounjaro, Saxenda ext if you do not have diabetes it is a automatic denied PA).Johny Chess

## 2021-09-08 NOTE — Telephone Encounter (Signed)
Pt was on cover-my-meds need PA on  Ozempic (2 MG/DOSE). Submitted w/ (Key: [id number]) Rx #: [id number]. PA sent to plan for determination.Marland KitchenJohny Chess

## 2022-01-09 ENCOUNTER — Ambulatory Visit: Payer: Managed Care, Other (non HMO) | Admitting: Nurse Practitioner

## 2022-01-26 DIAGNOSIS — Z1231 Encounter for screening mammogram for malignant neoplasm of breast: Secondary | ICD-10-CM | POA: Diagnosis not present

## 2022-01-26 LAB — HM MAMMOGRAPHY

## 2022-02-13 DIAGNOSIS — H47323 Drusen of optic disc, bilateral: Secondary | ICD-10-CM | POA: Diagnosis not present

## 2022-02-13 DIAGNOSIS — H47333 Pseudopapilledema of optic disc, bilateral: Secondary | ICD-10-CM | POA: Diagnosis not present

## 2022-02-13 LAB — HM DIABETES EYE EXAM

## 2022-02-14 ENCOUNTER — Encounter: Payer: Self-pay | Admitting: Internal Medicine

## 2022-05-18 DIAGNOSIS — H53452 Other localized visual field defect, left eye: Secondary | ICD-10-CM | POA: Diagnosis not present

## 2022-05-18 DIAGNOSIS — H47333 Pseudopapilledema of optic disc, bilateral: Secondary | ICD-10-CM | POA: Diagnosis not present

## 2022-05-18 DIAGNOSIS — H47323 Drusen of optic disc, bilateral: Secondary | ICD-10-CM | POA: Diagnosis not present

## 2022-07-16 DIAGNOSIS — K821 Hydrops of gallbladder: Secondary | ICD-10-CM | POA: Diagnosis not present

## 2022-07-16 DIAGNOSIS — K8020 Calculus of gallbladder without cholecystitis without obstruction: Secondary | ICD-10-CM | POA: Diagnosis not present

## 2022-07-16 DIAGNOSIS — K8000 Calculus of gallbladder with acute cholecystitis without obstruction: Secondary | ICD-10-CM | POA: Diagnosis not present

## 2022-07-16 DIAGNOSIS — R112 Nausea with vomiting, unspecified: Secondary | ICD-10-CM | POA: Diagnosis not present

## 2022-07-16 DIAGNOSIS — Z882 Allergy status to sulfonamides status: Secondary | ICD-10-CM | POA: Diagnosis not present

## 2022-07-16 DIAGNOSIS — R1011 Right upper quadrant pain: Secondary | ICD-10-CM | POA: Diagnosis not present

## 2022-07-16 DIAGNOSIS — Z79899 Other long term (current) drug therapy: Secondary | ICD-10-CM | POA: Diagnosis not present

## 2022-07-17 DIAGNOSIS — Z9049 Acquired absence of other specified parts of digestive tract: Secondary | ICD-10-CM | POA: Diagnosis not present

## 2022-07-17 DIAGNOSIS — K8012 Calculus of gallbladder with acute and chronic cholecystitis without obstruction: Secondary | ICD-10-CM | POA: Diagnosis not present

## 2022-07-17 DIAGNOSIS — K8010 Calculus of gallbladder with chronic cholecystitis without obstruction: Secondary | ICD-10-CM | POA: Diagnosis not present

## 2022-11-30 ENCOUNTER — Other Ambulatory Visit: Payer: Self-pay | Admitting: Internal Medicine

## 2022-11-30 DIAGNOSIS — Z1211 Encounter for screening for malignant neoplasm of colon: Secondary | ICD-10-CM

## 2022-11-30 DIAGNOSIS — Z1212 Encounter for screening for malignant neoplasm of rectum: Secondary | ICD-10-CM

## 2022-12-25 DIAGNOSIS — Z1211 Encounter for screening for malignant neoplasm of colon: Secondary | ICD-10-CM | POA: Diagnosis not present

## 2022-12-25 DIAGNOSIS — Z1212 Encounter for screening for malignant neoplasm of rectum: Secondary | ICD-10-CM | POA: Diagnosis not present

## 2023-01-02 LAB — COLOGUARD: COLOGUARD: NEGATIVE

## 2023-02-18 DIAGNOSIS — H524 Presbyopia: Secondary | ICD-10-CM | POA: Diagnosis not present

## 2023-02-18 DIAGNOSIS — H53452 Other localized visual field defect, left eye: Secondary | ICD-10-CM | POA: Diagnosis not present

## 2023-02-18 DIAGNOSIS — H52223 Regular astigmatism, bilateral: Secondary | ICD-10-CM | POA: Diagnosis not present

## 2023-02-18 DIAGNOSIS — H47323 Drusen of optic disc, bilateral: Secondary | ICD-10-CM | POA: Diagnosis not present

## 2023-02-18 DIAGNOSIS — H5213 Myopia, bilateral: Secondary | ICD-10-CM | POA: Diagnosis not present

## 2023-02-18 DIAGNOSIS — H47333 Pseudopapilledema of optic disc, bilateral: Secondary | ICD-10-CM | POA: Diagnosis not present

## 2023-07-30 ENCOUNTER — Encounter: Payer: Self-pay | Admitting: Internal Medicine

## 2023-08-07 DIAGNOSIS — Z1231 Encounter for screening mammogram for malignant neoplasm of breast: Secondary | ICD-10-CM | POA: Diagnosis not present

## 2023-08-08 LAB — HM MAMMOGRAPHY

## 2023-08-14 ENCOUNTER — Encounter: Payer: Self-pay | Admitting: Internal Medicine

## 2023-08-14 ENCOUNTER — Ambulatory Visit (INDEPENDENT_AMBULATORY_CARE_PROVIDER_SITE_OTHER): Admitting: Internal Medicine

## 2023-08-14 VITALS — BP 130/82 | HR 90 | Temp 98.3°F | Ht 63.0 in | Wt 187.0 lb

## 2023-08-14 DIAGNOSIS — G4709 Other insomnia: Secondary | ICD-10-CM | POA: Diagnosis not present

## 2023-08-14 DIAGNOSIS — R5383 Other fatigue: Secondary | ICD-10-CM | POA: Diagnosis not present

## 2023-08-14 DIAGNOSIS — Z0000 Encounter for general adult medical examination without abnormal findings: Secondary | ICD-10-CM | POA: Diagnosis not present

## 2023-08-14 LAB — CBC WITH DIFFERENTIAL/PLATELET
Basophils Absolute: 0.1 10*3/uL (ref 0.0–0.1)
Basophils Relative: 0.7 % (ref 0.0–3.0)
Eosinophils Absolute: 0.2 10*3/uL (ref 0.0–0.7)
Eosinophils Relative: 3.1 % (ref 0.0–5.0)
HCT: 38.6 % (ref 36.0–46.0)
Hemoglobin: 12.8 g/dL (ref 12.0–15.0)
Lymphocytes Relative: 34.2 % (ref 12.0–46.0)
Lymphs Abs: 2.5 10*3/uL (ref 0.7–4.0)
MCHC: 33.3 g/dL (ref 30.0–36.0)
MCV: 86.8 fl (ref 78.0–100.0)
Monocytes Absolute: 0.6 10*3/uL (ref 0.1–1.0)
Monocytes Relative: 8.3 % (ref 3.0–12.0)
Neutro Abs: 4 10*3/uL (ref 1.4–7.7)
Neutrophils Relative %: 53.7 % (ref 43.0–77.0)
Platelets: 387 10*3/uL (ref 150.0–400.0)
RBC: 4.45 Mil/uL (ref 3.87–5.11)
RDW: 13.1 % (ref 11.5–15.5)
WBC: 7.4 10*3/uL (ref 4.0–10.5)

## 2023-08-14 LAB — COMPREHENSIVE METABOLIC PANEL WITH GFR
ALT: 11 U/L (ref 0–35)
AST: 14 U/L (ref 0–37)
Albumin: 4.5 g/dL (ref 3.5–5.2)
Alkaline Phosphatase: 99 U/L (ref 39–117)
BUN: 15 mg/dL (ref 6–23)
CO2: 27 meq/L (ref 19–32)
Calcium: 9.5 mg/dL (ref 8.4–10.5)
Chloride: 108 meq/L (ref 96–112)
Creatinine, Ser: 0.78 mg/dL (ref 0.40–1.20)
GFR: 85.15 mL/min (ref >60.00)
Glucose, Bld: 87 mg/dL (ref 70–99)
Potassium: 3.9 meq/L (ref 3.5–5.1)
Sodium: 140 meq/L (ref 135–145)
Total Bilirubin: 0.3 mg/dL (ref 0.2–1.2)
Total Protein: 7.5 g/dL (ref 6.0–8.3)

## 2023-08-14 LAB — URINALYSIS
Bilirubin Urine: NEGATIVE
Ketones, ur: NEGATIVE
Leukocytes,Ua: NEGATIVE
Nitrite: NEGATIVE
Specific Gravity, Urine: <=1.005 — AB (ref 1.000–1.030)
Total Protein, Urine: NEGATIVE
Urine Glucose: NEGATIVE
Urobilinogen, UA: 0.2 (ref 0.0–1.0)
pH: 6 (ref 5.0–8.0)

## 2023-08-14 LAB — LIPID PANEL
Cholesterol: 181 mg/dL (ref 0–200)
HDL: 70.1 mg/dL (ref >39.00)
LDL Cholesterol: 99 mg/dL (ref 0–99)
NonHDL: 111.31
Total CHOL/HDL Ratio: 3
Triglycerides: 61 mg/dL (ref 0.0–149.0)
VLDL: 12.2 mg/dL (ref 0.0–40.0)

## 2023-08-14 LAB — TSH: TSH: 1.12 u[IU]/mL (ref 0.35–5.50)

## 2023-08-14 MED ORDER — VITAMIN D3 50 MCG (2000 UT) PO CAPS: 2000.0000 [IU] | ORAL_CAPSULE | Freq: Every day | ORAL | Status: AC

## 2023-08-14 NOTE — Assessment & Plan Note (Addendum)
 Labs ordered. Rest more.

## 2023-08-14 NOTE — Assessment & Plan Note (Signed)
 Pt decided not to do anything about it

## 2023-08-14 NOTE — Assessment & Plan Note (Addendum)
  We discussed age appropriate health related issues, including available/recomended screening tests and vaccinations. Labs were ordered to be later reviewed . All questions were answered. We discussed one or more of the following - seat belt use, use of sunscreen/sun exposure exercise, fall risk reduction, second hand smoke exposure, firearm use and storage, seat belt use, a need for adhering to healthy diet and exercise. Labs were ordered.  All questions were answered. [place] (-) 2021, 2024 at work.  Mammogram, eye exam up-to-date Patient declined vaccines offered; shingrix  discussed

## 2023-08-14 NOTE — Progress Notes (Signed)
 Subjective:  Patient ID: Kelly Lawrence, female    DOB: September 14, 1965  Age: 58 y.o. 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     topiramate (TOPAMAX) 100 MG tablet Take 100 mg by mouth daily.     eszopiclone  (LUNESTA ) 2 MG TABS tablet Take 1 tablet (2 mg total) by mouth at bedtime as needed for sleep. Take immediately before bedtime (Patient not taking: Reported on 08/14/2023) 30 tablet 3   phentermine  (ADIPEX-P ) 37.5 MG tablet Take 1 tablet (37.5 mg total) by mouth daily before breakfast. (Patient not taking: Reported on 08/14/2023) 30 tablet 2   Semaglutide , 2 MG/DOSE, (OZEMPIC , 2 MG/DOSE,) 8 MG/3ML SOPN INJECT 2 MG UNDER THE SKIN ONCE A WEEK AS DIRECTED (Patient not taking: Reported on 08/14/2023) 3 mL 5   Vitamin D , Ergocalciferol , (DRISDOL ) 1.25 MG (50000 UNIT) CAPS capsule Take 1 capsule (50,000 Units total) by mouth every 30 (thirty) days. (Patient not taking: Reported on 08/14/2023) 3 capsule 3   No facility-administered medications prior to visit.    ROS: Review of Systems  Constitutional:  Positive for fatigue and unexpected weight change. Negative for activity change, appetite change and chills.  HENT:  Negative for congestion, mouth sores and sinus pressure.   Eyes:  Negative for visual disturbance.  Respiratory:  Negative for cough and chest tightness.   Gastrointestinal:  Negative for abdominal pain and nausea.  Genitourinary:  Negative for difficulty urinating, frequency and vaginal pain.  Musculoskeletal:  Negative for back pain and gait problem.  Skin:  Negative for pallor and rash.  Neurological:  Negative for dizziness, tremors, weakness, numbness and headaches.  Psychiatric/Behavioral:   Positive for sleep disturbance. Negative for confusion and suicidal ideas. The patient is not nervous/anxious.     Objective:  BP 130/82   Pulse 90   Temp 98.3 F (36.8 C) (Oral)   Ht 5' 3 (1.6 m)   Wt 187 lb (84.8 kg)   LMP 08/23/2016   SpO2 99%   BMI 33.13 kg/m   BP Readings from Last 3 Encounters:  08/14/23 130/82  08/23/21 130/80  05/10/21 120/80    Wt Readings from Last 3 Encounters:  08/14/23 187 lb (84.8 kg)  08/23/21 160 lb (72.6 kg)  05/10/21 179 lb (81.2 kg)    Physical Exam Constitutional:      General: She is not in acute distress.    Appearance: Normal appearance. She is well-developed. She is obese.  HENT:     Head: Normocephalic.     Right Ear: External ear normal.     Left Ear: External ear normal.     Nose: Nose normal.   Eyes:     General:        Right eye: No discharge.        Left eye: No discharge.     Conjunctiva/sclera: Conjunctivae normal.     Pupils: Pupils are equal, round, and reactive to light.   Neck:     Thyroid : No thyromegaly.     Vascular: No JVD.     Trachea: No tracheal deviation.   Cardiovascular:     Rate and Rhythm: Normal rate and regular rhythm.  Heart sounds: Normal heart sounds.  Pulmonary:     Effort: No respiratory distress.     Breath sounds: No stridor. No wheezing.  Abdominal:     General: Bowel sounds are normal. There is no distension.     Palpations: Abdomen is soft. There is no mass.     Tenderness: There is no abdominal tenderness. There is no guarding or rebound.   Musculoskeletal:        General: No tenderness.     Cervical back: Normal range of motion and neck supple. No rigidity.     Right lower leg: No edema.     Left lower leg: No edema.  Lymphadenopathy:     Cervical: No cervical adenopathy.   Skin:    Findings: No erythema or rash.   Neurological:     Mental Status: She is oriented to person, place, and time.     Cranial Nerves: No cranial nerve deficit.     Motor: No abnormal  muscle tone.     Coordination: Coordination normal.     Deep Tendon Reflexes: Reflexes normal.   Psychiatric:        Behavior: Behavior normal.        Thought Content: Thought content normal.        Judgment: Judgment normal.     Lab Results  Component Value Date   WBC 6.6 12/06/2020   HGB 13.1 12/06/2020   HCT 39.4 12/06/2020   PLT 335.0 12/06/2020   GLUCOSE 79 05/10/2021   CHOL 200 12/06/2020   TRIG 69.0 12/06/2020   HDL 81.60 12/06/2020   LDLCALC 104 (H) 12/06/2020   ALT 12 05/10/2021   AST 18 05/10/2021   NA 138 05/10/2021   K 4.3 05/10/2021   CL 103 05/10/2021   CREATININE 0.73 05/10/2021   BUN 16 05/10/2021   CO2 29 05/10/2021   TSH 1.12 12/06/2020   HGBA1C 5.9 05/10/2021    MM DIGITAL SCREENING BILATERAL Result Date: 05/06/2019 CLINICAL DATA:  Screening. EXAM: DIGITAL SCREENING BILATERAL MAMMOGRAM WITH CAD COMPARISON:  Previous exam(s). ACR Breast Density Category b: There are scattered areas of fibroglandular density. FINDINGS: There are no findings suspicious for malignancy. Images were processed with CAD. IMPRESSION: No mammographic evidence of malignancy. A result letter of this screening mammogram will be mailed directly to the patient. RECOMMENDATION: Screening mammogram in one year. (Code:SM-B-01Y) BI-RADS CATEGORY  1: Negative. Electronically Signed   By: Catherin Closs M.D.   On: 05/06/2019 10:19    Assessment & Plan:   Problem List Items Addressed This Visit     Insomnia   Pt decided not to do anything about it        Well adult exam - Primary    We discussed age appropriate health related issues, including available/recomended screening tests and vaccinations. Labs were ordered to be later reviewed . All questions were answered. We discussed one or more of the following - seat belt use, use of sunscreen/sun exposure exercise, fall risk reduction, second hand smoke exposure, firearm use and storage, seat belt use, a need for adhering to healthy diet  and exercise. Labs were ordered.  All questions were answered. [place] (-) 2021, 2024 at work.  Mammogram, eye exam up-to-date Patient declined vaccines offered; shingrix  discussed        Relevant Orders   TSH   Urinalysis   CBC with Differential/Platelet   Lipid panel   Comprehensive metabolic panel with GFR   Fatigue   Labs ordered. Rest  more.         Meds ordered this encounter  Medications   Cholecalciferol (VITAMIN D3) 50 MCG (2000 UT) capsule    Sig: Take 1 capsule (2,000 Units total) by mouth daily.      Follow-up: Return in about 1 year (around 08/13/2024) for Wellness Exam.  Anitra Barn, MD

## 2023-08-15 ENCOUNTER — Ambulatory Visit: Payer: Self-pay | Admitting: Internal Medicine

## 2023-12-05 ENCOUNTER — Ambulatory Visit

## 2023-12-05 ENCOUNTER — Encounter: Payer: Self-pay | Admitting: Podiatry

## 2023-12-05 ENCOUNTER — Ambulatory Visit: Admitting: Podiatry

## 2023-12-05 DIAGNOSIS — M778 Other enthesopathies, not elsewhere classified: Secondary | ICD-10-CM

## 2023-12-05 DIAGNOSIS — M7752 Other enthesopathy of left foot: Secondary | ICD-10-CM

## 2023-12-05 DIAGNOSIS — M7672 Peroneal tendinitis, left leg: Secondary | ICD-10-CM | POA: Diagnosis not present

## 2023-12-05 MED ORDER — MELOXICAM 15 MG PO TABS: 15.0000 mg | ORAL_TABLET | Freq: Every day | ORAL | 0 refills | Status: AC

## 2023-12-05 NOTE — Progress Notes (Signed)
 Subjective:  Patient ID: Kelly Lawrence, female    DOB: March 09, 1965,  MRN: [id number]  Chief Complaint  Patient presents with   Left foot and Ankle pain    Left foot and ankle pain, describing PF pain with it being worse in the mornings and after resting. Feels like she has a pebble in her heel with dull, achy pain across the top of her foot and nto the lateral side back up to the ankle.  Pain X 4 months Not diabetic , no anti coag.     Discussed the use of AI scribe software for clinical note transcription with the patient, who gave verbal consent to proceed.  History of Present Illness Kelly Lawrence is a 58 year old female who presents with left heel and ankle pain.  She experiences pain in the bottom of her left heel, most noticeable in the morning or after prolonged sitting. The pain is rated as 7 out of 10 in intensity and is located at the posterior inferior aspect of the heel. She manages the pain with ice and stretching, though extensive stretching causes toe cramps.  Usually wears dress shoes or flats at work. She finds [employer] running shoes comfortable. 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      Objective:    Physical Exam EXTREMITIES: Pain on palpation of the posterior inferior aspect of the heel with local inflammation. DP and PT pulses palpable 2/4. Capillary refill less than 3 seconds to digits. No edema. MUSCULOSKELETAL: Decreased ankle joint dorsiflexion less than 10 degrees with knee extended. Tenderness on palpation of left lateral peroneal tendons and anterior lateral ankle joint around ATFL. Muscle strength 5/5 for all major muscle groups. No pain with inversion, eversion, dorsiflexion, and plantar flexion.   No images are attached to the encounter.    Results Left foot and ankle radiographs 12/05/2023: Normal osseous mineralization.  Joint spaces preserved.  No acute fractures or acute  osseous abnormalities.  Slight pes planus foot type.  Ankle mortise in good alignment.  Slight enlargement of posterior calcaneus.  No significant heel spur formation.   Assessment:   1. Peroneal tendonitis of left lower extremity   2. Left calcaneal bursitis      Plan:  Patient was evaluated and treated and all questions answered.  Assessment and Plan Assessment & Plan Left heel fat pad inflammation Chronic inflammation of the left heel fat pad, localized to the plantar inferior aspect, likely due to localized inflammation. Steroid injections not recommended due to potential soft tissue degradation. - Recommend supportive shoes with robust arch and midsole. Consider [place], [address], [employer], [place], [place], [place]. - Advise Tuli's Heel Cups for additional support and cushioning. - Prescribe meloxicam once daily for anti-inflammatory effects. - Instruct on Achilles stretching exercises 2-3 times daily, focusing on morning and evening. - Advise avoiding high-impact activities. - Provide work note for wearing sneakers.  Left peroneal tendinitis Tenderness on palpation of left lateral peroneal tendons and anterior lateral ankle joint, likely compensatory due to heel pain. Condition may benefit from stretching and anti-inflammatory treatment. Steroid injection considered if symptoms persist. - Include peroneal tendon stretching exercises in regimen. - Consider compressive anklet for support vs lace up ankle brace especially if not improving. - Reassess in 3-4 weeks to evaluate treatment effectiveness. - Consider steroid injection if symptoms persist and significantly aggravated.  Follow-Up Follow-up necessary to assess treatment effectiveness and make adjustments. - Schedule  follow-up appointment in 3-4 weeks to evaluate progress and consider further interventions.      Return in about 3 weeks (around 12/26/2023) for Left Heel pain/peroneal tendon pain.

## 2023-12-05 NOTE — Patient Instructions (Signed)

## 2023-12-30 ENCOUNTER — Ambulatory Visit: Admitting: Podiatry
